# Patient Record
Sex: Male | Born: 1975 | Race: White | Hispanic: No | Marital: Married | Smoking: Never smoker
Health system: Southern US, Community
[De-identification: ages and names within clinical notes are randomized; demographics above are authoritative.]

## PROBLEM LIST (undated history)

## (undated) DIAGNOSIS — R202 Paresthesia of skin: Secondary | ICD-10-CM

## (undated) DIAGNOSIS — I10 Essential (primary) hypertension: Secondary | ICD-10-CM

## (undated) DIAGNOSIS — R809 Proteinuria, unspecified: Secondary | ICD-10-CM

## (undated) DIAGNOSIS — E785 Hyperlipidemia, unspecified: Secondary | ICD-10-CM

## (undated) DIAGNOSIS — K219 Gastro-esophageal reflux disease without esophagitis: Secondary | ICD-10-CM

## (undated) DIAGNOSIS — R7989 Other specified abnormal findings of blood chemistry: Secondary | ICD-10-CM

## (undated) DIAGNOSIS — N179 Acute kidney failure, unspecified: Secondary | ICD-10-CM

## (undated) DIAGNOSIS — G4730 Sleep apnea, unspecified: Secondary | ICD-10-CM

## (undated) DIAGNOSIS — N189 Chronic kidney disease, unspecified: Secondary | ICD-10-CM

## (undated) DIAGNOSIS — N059 Unspecified nephritic syndrome with unspecified morphologic changes: Secondary | ICD-10-CM

## (undated) HISTORY — DX: Essential (primary) hypertension: I10

## (undated) HISTORY — DX: Paresthesia of skin: R20.2

## (undated) HISTORY — PX: OTHER SURGICAL HISTORY: SHX169

## (undated) HISTORY — DX: Hyperlipidemia, unspecified: E78.5

## (undated) HISTORY — DX: Proteinuria, unspecified: R80.9

## (undated) HISTORY — DX: Other specified abnormal findings of blood chemistry: R79.89

## (undated) HISTORY — DX: Chronic kidney disease, unspecified: N18.9

## (undated) HISTORY — DX: Sleep apnea, unspecified: G47.30

## (undated) HISTORY — DX: Unspecified nephritic syndrome with unspecified morphologic changes: N05.9

## (undated) HISTORY — DX: Acute kidney failure, unspecified: N17.9

## (undated) HISTORY — DX: Gastro-esophageal reflux disease without esophagitis: K21.9

---

## 2011-05-07 ENCOUNTER — Other Ambulatory Visit: Payer: Self-pay | Admitting: Internal Medicine

## 2011-05-07 DIAGNOSIS — R1012 Left upper quadrant pain: Secondary | ICD-10-CM

## 2011-05-08 ENCOUNTER — Ambulatory Visit
Admission: RE | Admit: 2011-05-08 | Discharge: 2011-05-08 | Disposition: A | Discharge status: Home / Self Care | Payer: 59 | Source: Ambulatory Visit | Attending: Internal Medicine | Admitting: Internal Medicine

## 2011-05-08 DIAGNOSIS — R1012 Left upper quadrant pain: Secondary | ICD-10-CM

## 2011-05-08 MED ORDER — IOHEXOL 300 MG/ML  SOLN
100.0000 mL | Freq: Once | INTRAMUSCULAR | Status: AC | PRN
  Administered 2011-05-08: 100 mL via INTRAVENOUS

## 2012-04-17 IMAGING — CT CT ABDOMEN W/ CM
2 of 4 series · 17 of 46 positions shown, 19 images · IV contrast (READICAT/WATER & [ID] OMNI [id number])
Comparison: None

CLINICAL DATA: Hepatomegaly.  Left upper quadrant pain.  Elevated
bilirubin.  Left abdominal mass.

CT ABDOMEN WITH CONTRAST
TECHNIQUE: Multidetector CT imaging of the abdomen was performed
using the standard protocol following bolus administration of
intravenous contrast.
Contrast: 100 ml Dmnipaque-KFF

[Series 2: abdomen w/ · axial · 0.70mm/px · z∈[-294,-40]mm · 14 of 56 slices shown, 16 images]
[im 3/56  soft-tissue]
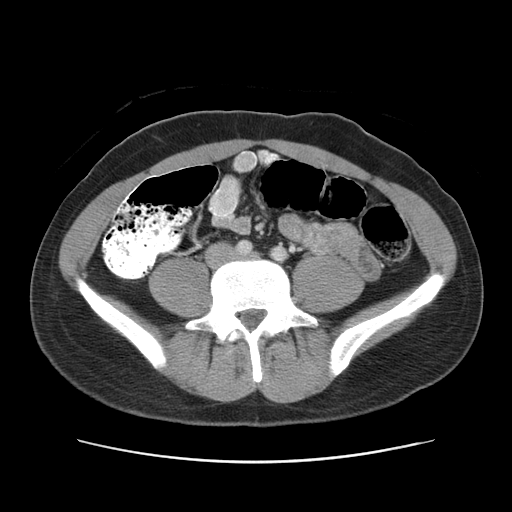
[im 3/56  bone]
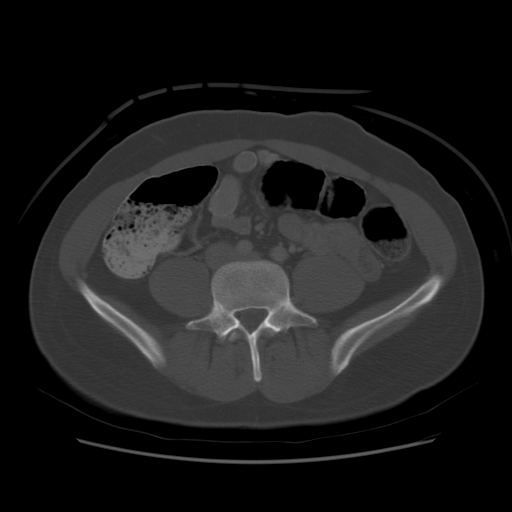
[im 8/56  soft-tissue]
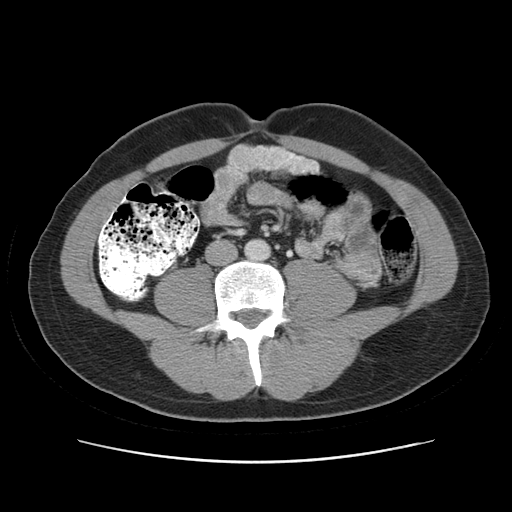
[im 11/56  soft-tissue]
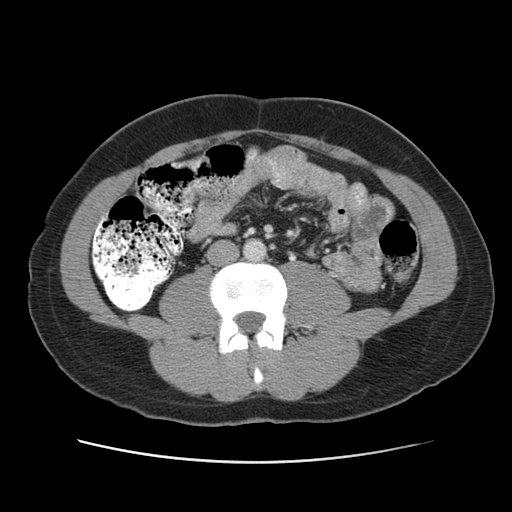
[im 16/56  soft-tissue]
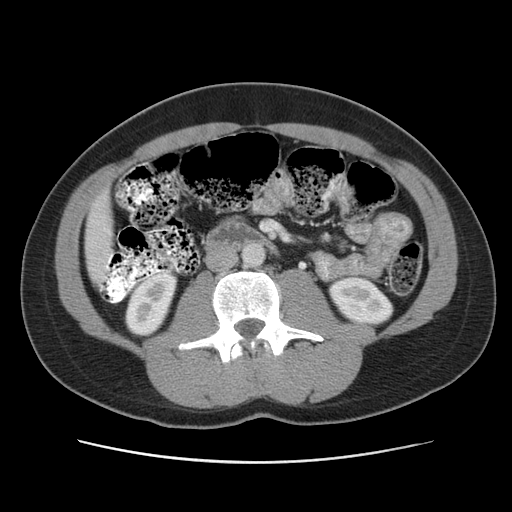
[im 18/56  soft-tissue]
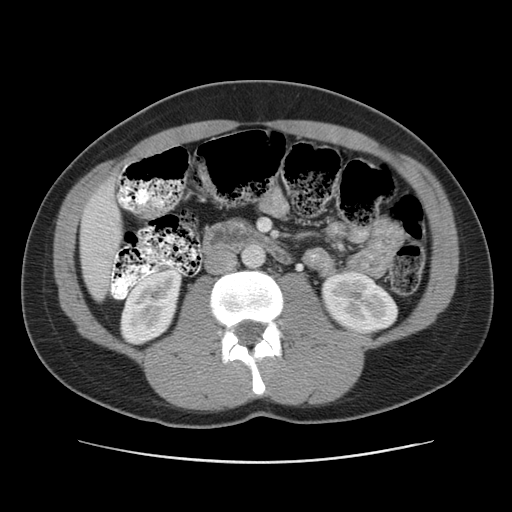
[im 23/56  soft-tissue]
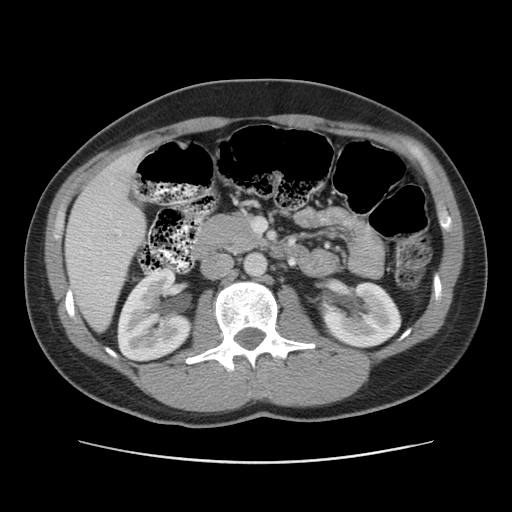
[im 26/56  soft-tissue]
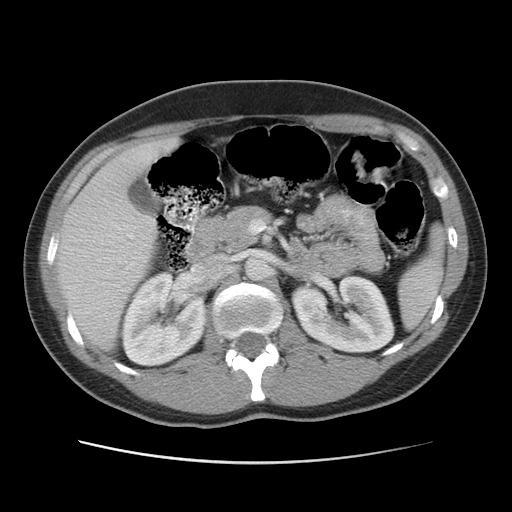
[im 31/56  soft-tissue]
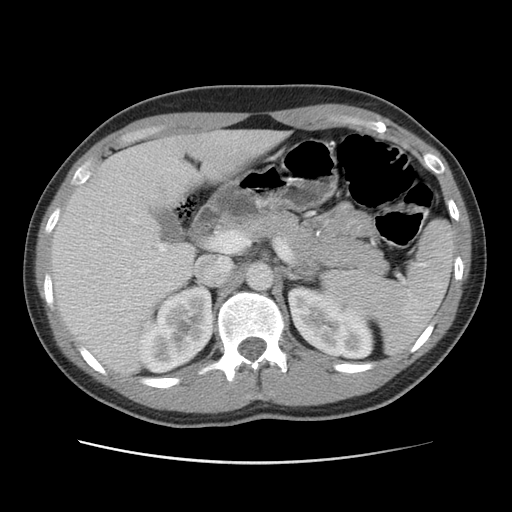
[im 33/56  soft-tissue]
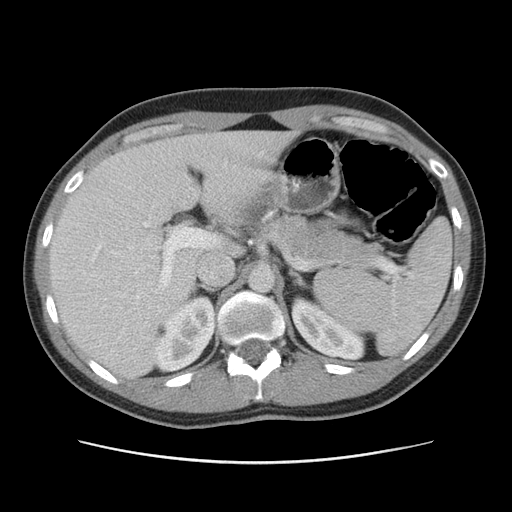
[im 33/56  bone]
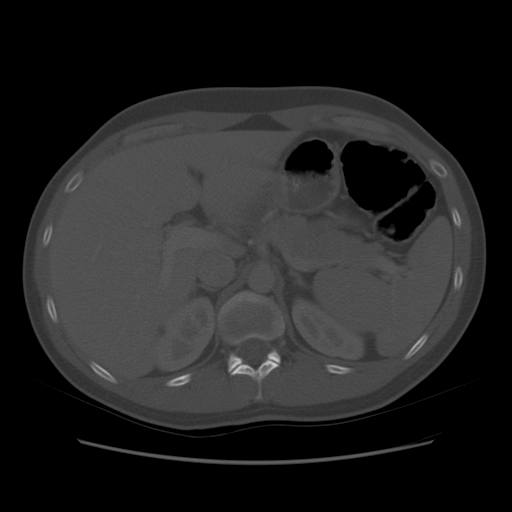
[im 38/56  soft-tissue]
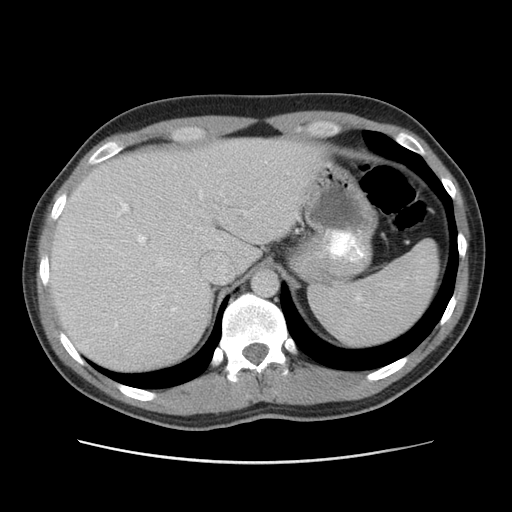
[im 41/56  soft-tissue]
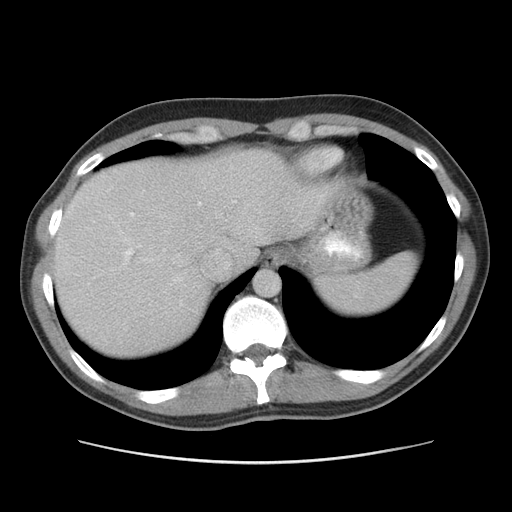
[im 46/56  soft-tissue]
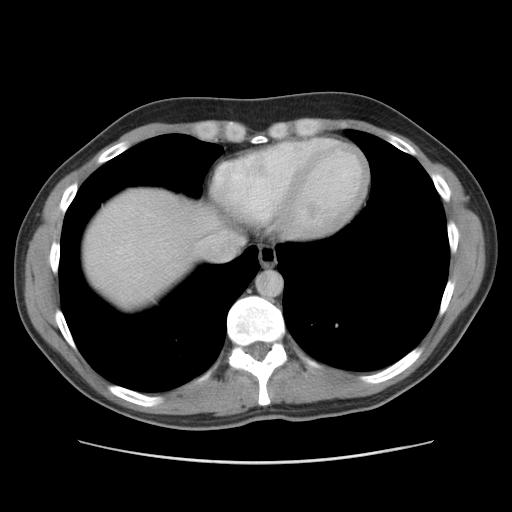
[im 48/56  soft-tissue]
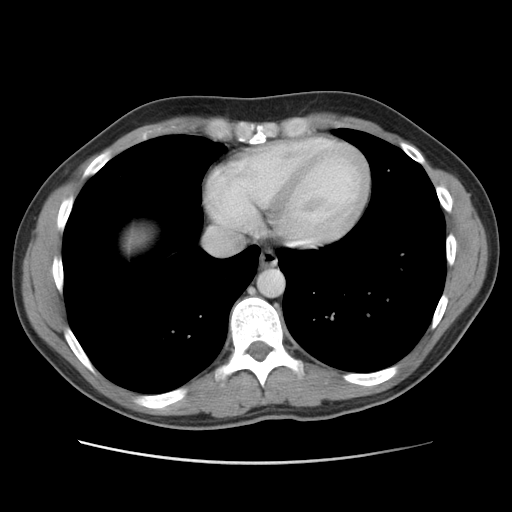
[im 53/56  soft-tissue]
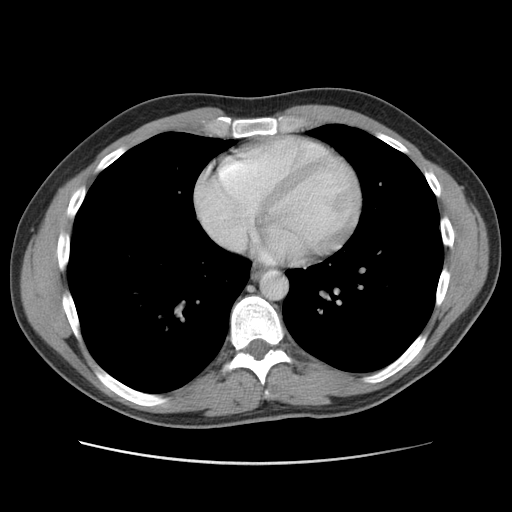

[Series 400: cor · coronal · 0.70mm/px · 3 of 108 slices shown]
[im 36/108  soft-tissue]
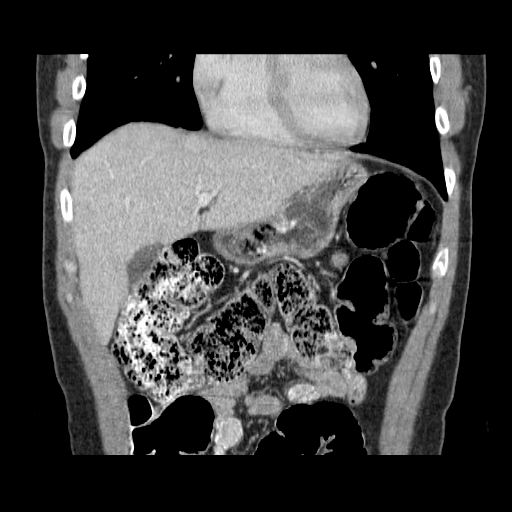
[im 48/108  soft-tissue]
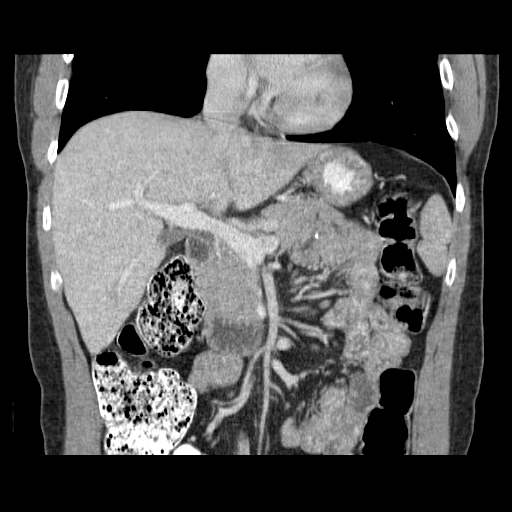
[im 60/108  soft-tissue]
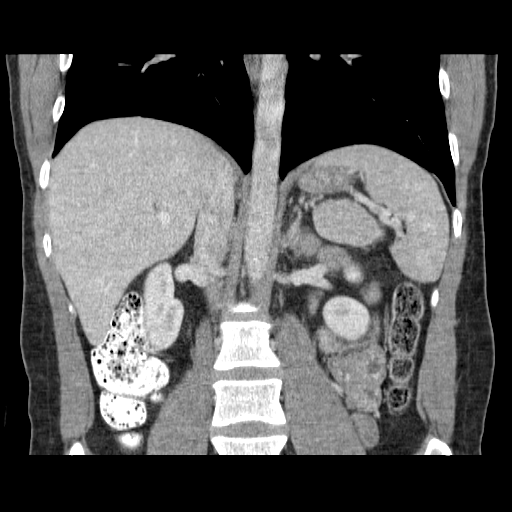

[17 of 46 positions shown; findings below may reference images not displayed]

FINDINGS: Lung bases are clear.  No pleural or pericardial fluid.

The liver parenchyma appears normal.  Liver size is normal with a
cephalocaudal extent of 16 cm.  No evidence of cyst or mass.  No
biliary ductal dilatation.  No calcified gallstones.  No evidence
of fatty change.

The spleen is normal extending over a length of 9 cm with
transverse dimension at the hilum of 4.9 x 10 cm.  No focal
lesions.

The pancreas is normal.  The adrenal glands are normal.  The
kidneys are normal.  The aorta and IVC are normal.  No
retroperitoneal mass or adenopathy.  There is a fairly large amount
of gas and fecal matter in the colon.  No primary bowel pathology
is evident.
IMPRESSION: The CT examination is within normal limits.  There is no evidence
of organomegaly or focal organ pathology.  The patient does have a
fairly large amount of gas and stool in the colon.

## 2013-05-09 ENCOUNTER — Encounter: Payer: Self-pay | Admitting: Neurology

## 2013-05-09 ENCOUNTER — Ambulatory Visit (INDEPENDENT_AMBULATORY_CARE_PROVIDER_SITE_OTHER): Payer: 59 | Admitting: Neurology

## 2013-05-09 VITALS — BP 121/76 | HR 74 | Temp 97.8°F | Ht 70.0 in | Wt 171.0 lb

## 2013-05-09 DIAGNOSIS — R202 Paresthesia of skin: Secondary | ICD-10-CM

## 2013-05-09 DIAGNOSIS — R259 Unspecified abnormal involuntary movements: Secondary | ICD-10-CM

## 2013-05-09 DIAGNOSIS — R253 Fasciculation: Secondary | ICD-10-CM

## 2013-05-09 DIAGNOSIS — R209 Unspecified disturbances of skin sensation: Secondary | ICD-10-CM

## 2013-05-09 DIAGNOSIS — R252 Cramp and spasm: Secondary | ICD-10-CM

## 2013-05-09 HISTORY — DX: Paresthesia of skin: R20.2

## 2013-05-09 NOTE — Progress Notes (Signed)
Subjective:    Patient ID: Paton Crum is a 37 y.o. male.  HPI  Huston Foley, MD, PhD [facility] [address], [address] [address] [place], [place] [address]  Dear Dr. Renne Crigler,   I saw your patient, Rayyan Burley, upon your kind request in my neurologic clinic today for initial consultation of his hand paresthesias. The patient is unaccompanied today. 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 Symptoms started in the L hand, in the palm and was initially in the aftermath of drinking more alcohol than he usually does. Around that time, he had a runny nose and woke up with nausea, head heaviness, and vomited and felt overall bad for the rest of the day. Symptoms progressed to the R hand and to the forearms and to both feet and he has had some L caff cramping. He has a longstanding Hx of fasciculations for the past 7 years and had EMG/NCV study on the LUE 7 years ago and has a longstanding Hx of anxiety. In the past few months he has had pruritus on his scalp without obvious rash, but occasionally feels bumps. He has no history of fevers or chills but does have night sweats which have been ongoing for a long time. These are intermittent. He has a FHx of dermatomyositis in his sister, no Hx of ALS, no neuropathy, no PD. He has a long-standing history of fatigue which dates back several years. He even had a sleep study several years ago which showed very mild obstructive sleep apnea. He did not need to use a CPAP machine but tried 1 on his own accord but stopped using it as he did not feel any better.   He does not drink alcohol regularly or excessively normally. He does not smoke. He has no prodromal illness at the time. Nobody else was sick around him. He was at a rheumatology conference at the time of onset of  symptoms. In the past week or 2 he has had no further symptoms. His fatigue  has improved. His calf cramping is better. He has no further tingling and never had much in the way of numbness. He never had much in the way of weakness. He does have a longer standing history of intermittent mild weakness in his left arm. He has no other new symptoms and no history of head, neck, or back injury.    His Past Medical History Is Significant For: History reviewed. No pertinent past medical history.  His Past Surgical History Is Significant For: History reviewed. No pertinent past surgical history.  His Family History Is Significant For: Family History  Problem Relation Age of Onset  . Fibromyalgia Mother   . Diabetes Mother   . Celiac disease Sister   . Anxiety disorder    . Depression      His Social History Is Significant For: History   Social History  . Marital Status: Married    Spouse Name: Judeth Cornfield    Number of Children: 2  . Years of Education: College   Occupational History  . [employer]    Social History Main Topics  . Smoking status: Never Smoker   . Smokeless tobacco: Never Used  . Alcohol Use: Yes     Comment: 2-3 cocktails weekly  . Drug Use: No  . Sexual Activity: None   Other  Topics Concern  . None   Social History Narrative   Patient lives at home with his spouse and children.   Caffeine Use: 3-4 cups of caffeine daily    His Allergies Are:  No Known Allergies:   His Current Medications Are:  Outpatient Encounter Prescriptions as of 05/09/2013  Medication Sig Dispense Refill  . acetaminophen (TYLENOL) 500 MG tablet Take 500 mg by mouth every 6 (six) hours as needed for pain.       No facility-administered encounter medications on file as of 05/09/2013.    Review of Systems  Musculoskeletal: Positive for myalgias.       Cramps  Skin:       itching    Objective:  Neurologic Exam  Physical Exam Physical Examination:   Filed Vitals:    05/09/13 0813  BP: 121/76  Pulse: 74  Temp: 97.8 F (36.6 C)    General Examination: The patient is a very pleasant 37 y.o. male in no acute distress. He appears well-developed and well-nourished and well groomed.   HEENT: Normocephalic, atraumatic, pupils are equal, round and reactive to light and accommodation. Funduscopic exam is normal with sharp disc margins noted. Extraocular tracking is good without limitation to gaze excursion or nystagmus noted. Normal smooth pursuit is noted. Hearing is grossly intact. Tympanic membranes are clear bilaterally. Face is symmetric with normal facial animation and normal facial sensation. Speech is clear with no dysarthria noted. There is no hypophonia. There is no lip, neck/head, jaw or voice tremor. Neck is supple with full range of passive and active motion. There are no carotid bruits on auscultation. Oropharynx exam reveals: no mouth dryness, adequate dental hygiene and  no significant  airway crowding. Mallampati is class I. Tongue protrudes centrally and palate elevates symmetrically. Tonsils are 1+.   Chest: Clear to auscultation without wheezing, rhonchi or crackles noted.  Heart: S1+S2+0, regular and normal without murmurs, rubs or gallops noted.   Abdomen: Soft, non-tender and non-distended with normal bowel sounds appreciated on auscultation.  Extremities: There is no pitting edema in the distal lower extremities bilaterally. Pedal pulses are intact.  Skin: Warm and dry without trophic changes noted. There are no varicose veins.  Musculoskeletal: exam reveals no obvious joint deformities, tenderness or joint swelling or erythema.   Neurologically:  Mental status: The patient is awake, alert and oriented in all 4 spheres. His memory, attention, language and knowledge are appropriate. There is no aphasia, agnosia, apraxia or anomia. Speech is clear with normal prosody and enunciation. Thought process is linear. Mood is congruent and affect is  normal.  Cranial nerves are as described above under HEENT exam. In addition, shoulder shrug is normal with equal shoulder height noted. Motor exam: Normal bulk, strength and tone is noted. There is no drift, tremor or rebound. Romberg is negative. Reflexes are 2+ throughout. Toes are downgoing bilaterally. Fine motor skills are intact with normal finger taps, normal hand movements, normal rapid alternating patting, normal foot taps and normal foot agility.  Cerebellar testing shows no dysmetria or intention tremor on finger to nose testing. Heel to shin is unremarkable bilaterally. There is no truncal or gait ataxia.  Sensory exam is intact to light touch, pinprick, vibration, temperature sense and proprioception in the upper and lower extremities.  Gait, station and balance are unremarkable. No veering to one side is noted. No leaning to one side is noted. Posture is age-appropriate and stance is narrow based. No problems turning are noted. He turns en bloc.  Tandem walk is unremarkable. Intact toe and heel stance is noted.               Assessment and Plan:   In summary, Fredy Gladu is a very pleasant 37 y.o.-year old male with a history of paresthesias that gradually progressed to involve all 4 extremities.  This started in the context of having had a more than normal for him amount of alcohol the night before and he woke up not feeling well. He vomited one time and felt bad for the rest of the day but no one else was sick around him and he had no diarrheal illness. He had no fevers. Interestingly, he has a constellation of some other constitutional symptoms which have been long-standing and other symptoms such as fasciculations which also have been ongoing for many years, not progressive. I'm not sure what caused his paresthesias and symptoms a few weeks ago and what caused them to subside. Reassuringly, his exam is completely normal at this time. I suggested that we is normal watchful waiting  position. Furthermore, I am not sure how to tie in all of his symptoms together under one possible etiology. He looks really well and recently had some blood work in your office which I reviewed which included a normal B12, normal TSH, normal CMP and CBC. I suggested that we do not proceed with any additional workup at this time as he looks so well and feels well. I asked him to consider coming back on an as-needed basis and if he has any flareup of these symptoms or any new neurological symptoms I would be happy to see him. Maybe we will add some blood work at that time, including inflammatory and autoimmune markers. At this juncture I suggested that I see him back if needed. He was in agreement.    Thank you very much for allowing me to participate in the care of this nice patient. If I can be of any further assistance to you please do not hesitate to call me at [phone].  Sincerely,   Huston Foley, MD, PhD

## 2013-05-09 NOTE — Patient Instructions (Addendum)
I think overall you are doing fairly well but I do want to suggest a few things today:   Remember to drink plenty of fluid, eat healthy meals and do not skip any meals. Try to eat protein with a every meal and eat a healthy snack such as fruit or nuts in between meals. Try to keep a regular sleep-wake schedule and try to exercise daily, particularly in the form of walking, 20-30 minutes a day, if you can.   As far as your medications are concerned, I would like to suggest no new meds.    As far as diagnostic testing: we will consider blood word with inflammatory and autoimmune markers.    I would like to see you back as needed. Please call us with any new questions, concerns, problems, updates.  We are not ordering any new test. Brett Canales is my clinical assistant and will answer any of your questions and relay your messages to me and also relay most of my messages to you.  Our phone number is [phone]. We also have an after hours call service for urgent matters and there is a physician on-call for urgent questions. For any emergencies you know to call 911 or go to the nearest emergency room.

## 2014-04-21 DEATH — deceased

## 2015-01-20 DEATH — deceased

## 2015-11-14 ENCOUNTER — Other Ambulatory Visit: Payer: Self-pay | Admitting: Internal Medicine

## 2015-11-14 DIAGNOSIS — R809 Proteinuria, unspecified: Secondary | ICD-10-CM

## 2015-11-14 DIAGNOSIS — R7989 Other specified abnormal findings of blood chemistry: Secondary | ICD-10-CM

## 2015-11-18 ENCOUNTER — Ambulatory Visit
Admission: RE | Admit: 2015-11-18 | Discharge: 2015-11-18 | Disposition: A | Discharge status: Home / Self Care | Payer: Self-pay | Source: Ambulatory Visit | Attending: Internal Medicine | Admitting: Internal Medicine

## 2015-11-18 DIAGNOSIS — R809 Proteinuria, unspecified: Secondary | ICD-10-CM

## 2015-11-18 DIAGNOSIS — R7989 Other specified abnormal findings of blood chemistry: Secondary | ICD-10-CM

## 2015-12-19 ENCOUNTER — Other Ambulatory Visit: Payer: Self-pay | Admitting: Radiology

## 2015-12-19 ENCOUNTER — Other Ambulatory Visit (HOSPITAL_COMMUNITY): Payer: Self-pay | Admitting: Nephrology

## 2015-12-19 DIAGNOSIS — N009 Acute nephritic syndrome with unspecified morphologic changes: Secondary | ICD-10-CM

## 2015-12-20 ENCOUNTER — Other Ambulatory Visit: Payer: Self-pay | Admitting: Radiology

## 2015-12-20 ENCOUNTER — Ambulatory Visit (HOSPITAL_COMMUNITY): Admission: RE | Admit: 2015-12-20 | Payer: Self-pay | Source: Ambulatory Visit

## 2015-12-20 NOTE — Patient Instructions (Signed)
Instructions for pre biopsy, needs driver, NPO after MN, am meds with sips of water,

## 2015-12-23 ENCOUNTER — Ambulatory Visit (HOSPITAL_COMMUNITY)
Admission: RE | Admit: 2015-12-23 | Discharge: 2015-12-23 | Disposition: A | Discharge status: Home / Self Care | Payer: Self-pay | Source: Ambulatory Visit | Attending: Nephrology | Admitting: Nephrology

## 2015-12-23 ENCOUNTER — Encounter (HOSPITAL_COMMUNITY): Payer: Self-pay

## 2015-12-23 DIAGNOSIS — N009 Acute nephritic syndrome with unspecified morphologic changes: Secondary | ICD-10-CM | POA: Insufficient documentation

## 2015-12-23 DIAGNOSIS — N057 Unspecified nephritic syndrome with diffuse crescentic glomerulonephritis: Secondary | ICD-10-CM | POA: Insufficient documentation

## 2015-12-23 DIAGNOSIS — I10 Essential (primary) hypertension: Secondary | ICD-10-CM | POA: Insufficient documentation

## 2015-12-23 DIAGNOSIS — Z79899 Other long term (current) drug therapy: Secondary | ICD-10-CM | POA: Insufficient documentation

## 2015-12-23 HISTORY — DX: Essential (primary) hypertension: I10

## 2015-12-23 LAB — CBC
HEMATOCRIT: 32.9 % — AB (ref 39.0–52.0)
HEMOGLOBIN: 11.8 g/dL — AB (ref 13.0–17.0)
MCH: 30.4 pg (ref 26.0–34.0)
MCHC: 35.9 g/dL (ref 30.0–36.0)
MCV: 84.8 fL (ref 78.0–100.0)
Platelets: 176 10*3/uL (ref 150–400)
RBC: 3.88 MIL/uL — AB (ref 4.22–5.81)
RDW: 13 % (ref 11.5–15.5)
WBC: 8.1 10*3/uL (ref 4.0–10.5)

## 2015-12-23 LAB — APTT: aPTT: 26 seconds (ref 24–37)

## 2015-12-23 LAB — PROTIME-INR
INR: 1.03 (ref 0.00–1.49)
Prothrombin Time: 13.7 seconds (ref 11.6–15.2)

## 2015-12-23 MED ORDER — FENTANYL CITRATE (PF) 100 MCG/2ML IJ SOLN
INTRAMUSCULAR | Status: AC
  Filled 2015-12-23: qty 2

## 2015-12-23 MED ORDER — HYDROCODONE-ACETAMINOPHEN 5-325 MG PO TABS: 1.0000 | ORAL_TABLET | ORAL | Status: DC | PRN

## 2015-12-23 MED ORDER — MIDAZOLAM HCL 2 MG/2ML IJ SOLN
INTRAMUSCULAR | Status: AC
  Filled 2015-12-23: qty 2

## 2015-12-23 MED ORDER — MIDAZOLAM HCL 2 MG/2ML IJ SOLN
INTRAMUSCULAR | Status: AC | PRN
  Administered 2015-12-23 (×2): 1 mg via INTRAVENOUS

## 2015-12-23 MED ORDER — SODIUM CHLORIDE 0.9 % IV SOLN: Freq: Once | INTRAVENOUS | Status: DC

## 2015-12-23 MED ORDER — LIDOCAINE HCL (PF) 1 % IJ SOLN
INTRAMUSCULAR | Status: AC
  Filled 2015-12-23: qty 10

## 2015-12-23 MED ORDER — FENTANYL CITRATE (PF) 100 MCG/2ML IJ SOLN
INTRAMUSCULAR | Status: AC | PRN
  Administered 2015-12-23 (×2): 50 ug via INTRAVENOUS

## 2015-12-23 MED ORDER — SODIUM CHLORIDE 0.9 % IV SOLN
INTRAVENOUS | Status: AC | PRN
  Administered 2015-12-23: 10 mL/h via INTRAVENOUS

## 2015-12-23 NOTE — H&P (Signed)
Chief Complaint: Patient was seen in consultation today for random renal biopsy at the request of [facility]  Referring Physician(s): Coladonato,Joseph  Supervising Physician: Arne Cleveland  History of Present Illness: Collin Wright is a 40 y.o. 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 He is referred for US guided random renal biopsy Has been NPO this am. PMHx, meds, labs, allergies reviewed. Wife at bedisde.  Past Medical History  Diagnosis Date  . Paresthesias 05/09/2013  . Hypertension     New diagnosis, 6 weeks ago     History reviewed. No pertinent past surgical history.  Allergies: Review of patient's allergies indicates no known allergies.  Medications: Prior to Admission medications   Medication Sig Start Date End Date Taking? Authorizing Provider  acetaminophen (TYLENOL) 500 MG tablet Take 500 mg by mouth every 6 (six) hours as needed for pain.   Yes Historical Provider, MD  LORazepam (ATIVAN) 1 MG tablet Take 1 mg by mouth as needed for anxiety (Uses when flying).    Yes Historical Provider, MD  losartan (COZAAR) 50 MG tablet Take 50 mg by mouth daily. 12/02/15  Yes Historical Provider, MD     Family History  Problem Relation Age of Onset  . Fibromyalgia Mother   . Diabetes Mother   . Celiac disease Sister   . Anxiety disorder    . Depression      Social History   Social History  . Marital Status: Married    Spouse Name: Colletta [place]  . Number of Children: 2  . Years of Education: College   Occupational History  . [employer]    Social History Main Topics  . Smoking status: Never Smoker   . Smokeless tobacco: Never Used  . Alcohol Use: Yes     Comment: 2-3 cocktails weekly  . Drug Use: No  . Sexual Activity: Yes    Birth Control/ Protection: None   Other Topics Concern  . None   Social History Narrative   Patient lives at home with his spouse and children.   Caffeine Use: 3-4 cups of caffeine daily     ECOG Status: 0 - Asymptomatic  Review of Systems: A 12 point ROS discussed and pertinent positives are indicated in the HPI above.  All other systems are negative.  Review of Systems  Vital Signs: BP 128/76 mmHg  Pulse 76  Temp(Src) 98.3 F (36.8 C)  Resp 18  Ht 5' 10"  (1.778 m)  Wt 169 lb (76.658 kg)  BMI 24.25 kg/m2  SpO2 100%  Physical Exam  Constitutional: He is oriented to person, place, and time. He appears well-developed and well-nourished. No distress.  HENT:  Head: Normocephalic.  Mouth/Throat: Oropharynx is clear and moist.  Neck: Normal range of motion. No tracheal deviation present.  Pulmonary/Chest: Effort normal and breath sounds normal. No respiratory distress.  Neurological: He is alert and oriented to person, place, and time.  Skin: Skin is warm and dry.  Psychiatric: He has a normal mood and affect. Judgment normal.    Mallampati Score:  MD Evaluation Airway: WNL Heart: WNL Abdomen: WNL Chest/ Lungs: WNL ASA  Classification: 2 Mallampati/Airway Score: One  Imaging: No results found.  Labs:  CBC:  Recent Labs  12/23/15 0700  WBC 8.1  HGB 11.8*  HCT 32.9*  PLT 176    COAGS: No results for input(s): INR, APTT in the last 8760 hours.  BMP: No results for input(s): NA, K, CL, CO2, GLUCOSE, BUN, CALCIUM, CREATININE, GFRNONAA, GFRAA in the last 8760  hours.  Invalid input(s): CMP   Assessment and Plan: Proteinuria.glomerulonephritis For US guided random renal biopsy Labs pending Risks and Benefits discussed with the patient including, but not limited to bleeding, infection, damage to adjacent structures or low yield requiring additional tests. All of the patient's questions were answered, patient is agreeable to proceed. Consent signed and in chart.    Thank you for this interesting consult.  I greatly enjoyed meeting Collin Wright and look forward to participating in their care.  A copy of this report was sent to the  requesting provider on this date.  Electronically Signed: Ascencion Dike 12/23/2015, 7:56 AM   I spent a total of 20 minutes in face to face in clinical consultation, greater than 50% of which was counseling/coordinating care for renal biopsy

## 2015-12-23 NOTE — Sedation Documentation (Signed)
Patient is resting comfortably. 

## 2015-12-23 NOTE — Discharge Instructions (Signed)
Kidney Biopsy, Care After Refer to this sheet in the next few weeks. These instructions provide you with information on caring for yourself after your procedure. Your health care provider may also give you more specific instructions. Your treatment has been planned according to current medical practices, but problems sometimes occur. Call your health care provider if you have any problems or questions after your procedure.  WHAT TO EXPECT AFTER THE PROCEDURE   You may notice blood in the urine for the first 24 hours after the biopsy.  You may feel some pain at the biopsy site for 1-2 weeks after the biopsy. HOME CARE INSTRUCTIONS  Do not lift anything heavier than 10 lb (4.5 kg) for 2 weeks.  Do not take any non-steroidal anti-inflammatory drugs (NSAIDs) or any blood thinners for a week after the biopsy unless instructed to do so by your health care provider.  Only take medicines for pain, fever, or discomfort as directed by your health care provider. SEEK MEDICAL CARE IF:  You have bloody urine more than 24 hours after the biopsy.   You develop a fever.   You cannot urinate.   You have increasing pain at the biopsy site.  SEEK IMMEDIATE MEDICAL CARE IF: You feel faint or dizzy.    This information is not intended to replace advice given to you by your health care provider. Make sure you discuss any questions you have with your health care provider.   Document Released: 05/10/2013 Document Reviewed: 05/10/2013 Elsevier Interactive Patient Education [employer].

## 2015-12-23 NOTE — Procedures (Signed)
US renal core RLP bx 16g x2 to surg path No complication No blood loss. See complete dictation in [facility].

## 2015-12-23 NOTE — Sedation Documentation (Signed)
Patient denies pain and is resting comfortably.  

## 2015-12-30 ENCOUNTER — Ambulatory Visit (HOSPITAL_COMMUNITY): Payer: Self-pay

## 2016-01-01 ENCOUNTER — Encounter (HOSPITAL_COMMUNITY): Payer: Self-pay

## 2016-02-12 ENCOUNTER — Other Ambulatory Visit (HOSPITAL_COMMUNITY): Payer: Self-pay | Admitting: Nephrology

## 2016-02-12 DIAGNOSIS — R0609 Other forms of dyspnea: Principal | ICD-10-CM

## 2016-03-04 ENCOUNTER — Other Ambulatory Visit (HOSPITAL_COMMUNITY): Payer: Self-pay

## 2016-03-10 ENCOUNTER — Other Ambulatory Visit (HOSPITAL_COMMUNITY): Payer: Self-pay

## 2016-03-11 ENCOUNTER — Encounter: Payer: Self-pay | Admitting: Cardiovascular Disease

## 2016-03-18 ENCOUNTER — Other Ambulatory Visit (HOSPITAL_COMMUNITY): Payer: Self-pay

## 2016-03-25 ENCOUNTER — Encounter: Payer: Self-pay | Admitting: Cardiovascular Disease

## 2016-03-25 ENCOUNTER — Ambulatory Visit (INDEPENDENT_AMBULATORY_CARE_PROVIDER_SITE_OTHER): Payer: Self-pay | Admitting: Cardiovascular Disease

## 2016-03-25 ENCOUNTER — Encounter (INDEPENDENT_AMBULATORY_CARE_PROVIDER_SITE_OTHER): Payer: Self-pay

## 2016-03-25 VITALS — BP 124/74 | HR 69 | Ht 70.0 in | Wt 174.4 lb

## 2016-03-25 DIAGNOSIS — R0602 Shortness of breath: Secondary | ICD-10-CM

## 2016-03-25 DIAGNOSIS — N289 Disorder of kidney and ureter, unspecified: Secondary | ICD-10-CM

## 2016-03-25 DIAGNOSIS — I151 Hypertension secondary to other renal disorders: Secondary | ICD-10-CM

## 2016-03-25 DIAGNOSIS — R0609 Other forms of dyspnea: Secondary | ICD-10-CM | POA: Insufficient documentation

## 2016-03-25 DIAGNOSIS — N2889 Other specified disorders of kidney and ureter: Secondary | ICD-10-CM

## 2016-03-25 NOTE — Progress Notes (Signed)
Cardiology Office Note   Date:  03/25/2016   ID:  Collin Wright, DOB [birth year], MRN [id number]  PCP:  Horatio Pel, MD  Cardiologist:   Mertie Moores, MD   Chief Complaint  Patient presents with  . Shortness of Breath   Problem list 1. Essential hypertension 2. IgA nephropathy - Proteinuria 3. Chronic kidney disease   History of Present Illness: Collin Wright is a 40 y.o. male who presents for further evaluation of shortness of breath. He has IgA nephropathy which was diagnosed after he presented with proteinuria. His chronic kidney disease. He Has recently has been having some problems with shortness of breath with exertion.  Went for a physical for life insurance - was found to have proteinuria - eventually was found to have IgA nephropathy Started on Losartan Also discovered that he was anemic. Sees Dr. Burman Foster -  Thought to be Epo related.  Has had a decrease in exercise tolerence.  Has noticed some DOE with jogging and also some chest pain with jogging .    Never smoker.   Cholesterol levels have been good Family hx of IgA nephropathy by biopsy Was also found to  Have C1Q antibodies ( found in  Lupus )  Father died of MI ( smoker , obesity )  HTN in mother    Has been health,   Jogs regularly but not much for the past 6-8 weeks.  No PND or orthopnea  No edema .   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 Paresthesias 05/09/2013  . Hypertension     New diagnosis, 6 weeks ago   . Nephritis   . Non-nephrotic range proteinuria   . Acute kidney injury ([place])   . Accelerated hypertension   . Glomerulonephritis     Past Surgical History  Procedure Laterality Date  . None       Current Outpatient Prescriptions  Medication Sig Dispense Refill  . acetaminophen (TYLENOL) 500 MG tablet Take 500 mg by mouth every 6 (six) hours as needed for pain.    Marland Kitchen LORazepam (ATIVAN) 1  MG tablet Take 1 mg by mouth as needed for anxiety (Uses when flying).     Marland Kitchen losartan (COZAAR) 50 MG tablet Take 50 mg by mouth 2 (two) times daily.   2   No current facility-administered medications for this visit.    Allergies:   Review of patient's allergies indicates no known allergies.    Social History:  The patient  reports that he has never smoked. He has never used smokeless tobacco. He reports that he drinks alcohol. He reports that he does not use illicit drugs.   Family History:  The patient's family history includes Anxiety disorder in his sister; CAD in his father; Celiac disease in his mother; Diabetes in his mother; Fibromyalgia in his mother; Hypertension in his father and mother; Kidney disease in his sister.    ROS:  Please see the history of present illness.    Review of Systems: Constitutional:  denies fever, chills, diaphoresis, appetite change and fatigue.  HEENT: denies photophobia, eye pain, redness, hearing loss, ear pain, congestion, sore throat, rhinorrhea, sneezing, neck pain, neck stiffness and tinnitus.  Respiratory: admits to SOB, DOE, cough, chest tightness, and wheezing.  Cardiovascular: denies chest pain, palpitations and leg swelling.  Gastrointestinal: denies nausea, vomiting, abdominal pain, diarrhea, constipation, blood in stool.  Genitourinary: denies dysuria, urgency, frequency, hematuria, flank  pain and difficulty urinating.  Musculoskeletal: denies  myalgias, back pain, joint swelling, arthralgias and gait problem.   Skin: denies pallor, rash and wound.  Neurological: denies dizziness, seizures, syncope, weakness, light-headedness, numbness and headaches.   Hematological: denies adenopathy, easy bruising, personal or family bleeding history.  Psychiatric/ Behavioral: denies suicidal ideation, mood changes, confusion, nervousness, sleep disturbance and agitation.       All other systems are reviewed and negative.    PHYSICAL EXAM: VS:  Ht  5' 10"  (1.778 m)  Wt 174 lb 6.4 oz (79.107 kg)  BMI 25.02 kg/m2 , BMI Body mass index is 25.02 kg/(m^2). GEN: Well nourished, well developed, in no acute distress HEENT: normal Neck: no JVD, carotid bruits, or masses Cardiac: RRR; no murmurs, rubs, or gallops,no edema  Respiratory:  clear to auscultation bilaterally, normal work of breathing GI: soft, nontender, nondistended, + BS MS: no deformity or atrophy Skin: warm and dry, no rash Neuro:  Strength and sensation are intact Psych: normal   EKG:  EKG is ordered today. The ekg ordered today demonstrates NSR at 69.     Recent Labs: 12/23/2015: Hemoglobin 11.8*; Platelets 176    Lipid Panel No results found for: CHOL, TRIG, HDL, CHOLHDL, VLDL, LDLCALC, LDLDIRECT    Wt Readings from Last 3 Encounters:  03/25/16 174 lb 6.4 oz (79.107 kg)  12/23/15 169 lb (76.658 kg)  05/09/13 171 lb (77.565 kg)      Other studies Reviewed: Additional studies/ records that were reviewed today include: . Review of the above records demonstrates:    ASSESSMENT AND PLAN:  1.  Dyspnea with exertion: Collin Wright presents with dyspnea on exertion for the past several months.   He's been diagnosed with hypertension within the past 6 weeks. He also has developed chronic kidney disease that is thought to be due to an IgA nephropathy or similar disease. He's been healthy all his life but has noticed shortness of breath with jogging over the past several weeks. He also has been found to have anemia.  We discussed possible etiologies. I think that he has a low likelihood of having significant coronary artery disease. We discussed getting a coronary calcium score is a question needs to be answered. I think our best option is to proceed with an echocardiogram which will give [place] left ventricular function as well as valvular function. He's also concerned about some pulmonary hypertension also Bil she's given his chronic kidney disease.  We also discussed possibly  getting a cardiac MRI if we need definitive evaluation of his cardiac structure.  I'll see him on an as-needed basis.  2. Hypertension: He is currently on losartan. His blood pressure is well-controlled.  3. IgA nephropathy-further plans and management by Dr.  Sheela Stack.  4. 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 Further plans per nephrology.     Current medicines are reviewed at length with the patient today.  The patient does not have concerns regarding medicines.  The following changes have been made:  no change  Labs/ tests ordered today include:  No orders of the defined types were placed in this encounter.     Disposition:   FU with me as needed     Mertie Moores, MD  03/25/2016 8:55 AM    [facility] [place], [place], [place]  [address] Phone: [phone]; Fax: [phone]   [facility]  [address] [place], [place]  [address] [phone]   Fax (  [phone]    

## 2016-03-25 NOTE — Patient Instructions (Signed)
Medication Instructions:  Your physician recommends that you continue on your current medications as directed. Please refer to the Current Medication list given to you today.  Labwork: No new orders.   Testing/Procedures: Your physician has requested that you have an echocardiogram. Echocardiography is a painless test that uses sound waves to create images of your heart. It provides your doctor with information about the size and shape of your heart and how well your heart's chambers and valves are working. This procedure takes approximately one hour. There are no restrictions for this procedure.  Follow-Up: Your physician recommends that you schedule a follow-up appointment as needed with Dr Acie Fredrickson.    Any Other Special Instructions Will Be Listed Below (If Applicable).     If you need a refill on your cardiac medications before your next appointment, please call your pharmacy.

## 2016-04-09 ENCOUNTER — Ambulatory Visit (HOSPITAL_COMMUNITY): Payer: Self-pay | Attending: Cardiology

## 2016-04-09 ENCOUNTER — Other Ambulatory Visit: Payer: Self-pay

## 2016-04-09 DIAGNOSIS — I358 Other nonrheumatic aortic valve disorders: Secondary | ICD-10-CM | POA: Insufficient documentation

## 2016-04-09 DIAGNOSIS — N2889 Other specified disorders of kidney and ureter: Secondary | ICD-10-CM

## 2016-04-09 DIAGNOSIS — I151 Hypertension secondary to other renal disorders: Secondary | ICD-10-CM

## 2016-04-09 DIAGNOSIS — I071 Rheumatic tricuspid insufficiency: Secondary | ICD-10-CM | POA: Insufficient documentation

## 2016-04-09 DIAGNOSIS — R0602 Shortness of breath: Secondary | ICD-10-CM

## 2016-04-09 DIAGNOSIS — I371 Nonrheumatic pulmonary valve insufficiency: Secondary | ICD-10-CM | POA: Insufficient documentation

## 2016-10-28 IMAGING — US US RENAL
1 series · 14 of 25 positions shown · non-contrast
Comparison: 05/08/2011 CTA of the abdomen.

CLINICAL DATA: Elevated creatinine.  Proteinuria.

EXAM:
RENAL / URINARY TRACT ULTRASOUND COMPLETE

[Series 1: us renal · 0.22mm/px · 14 of 30 slices shown]
[im 1/30]
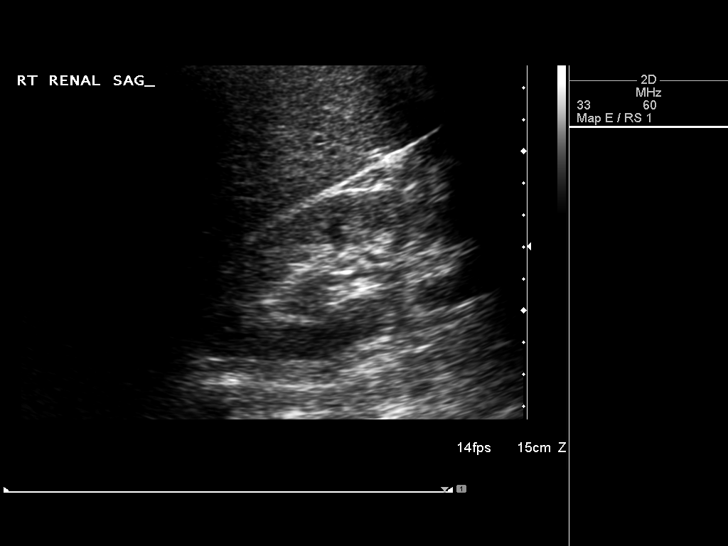
[im 3/30]
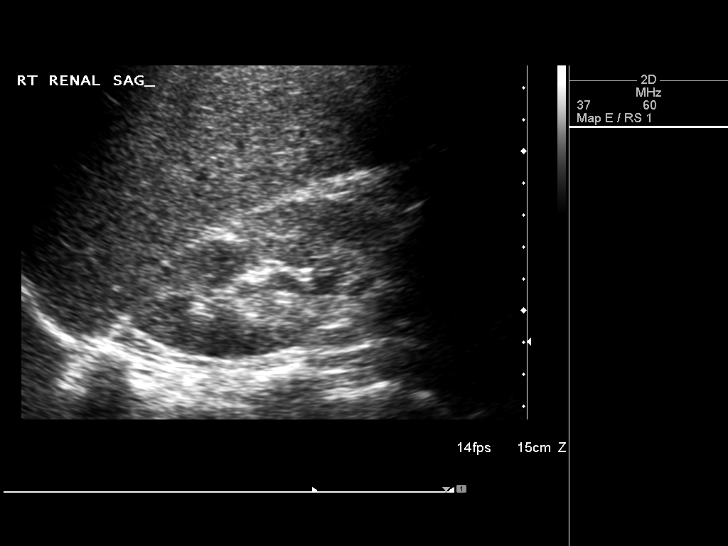
[im 5/30]
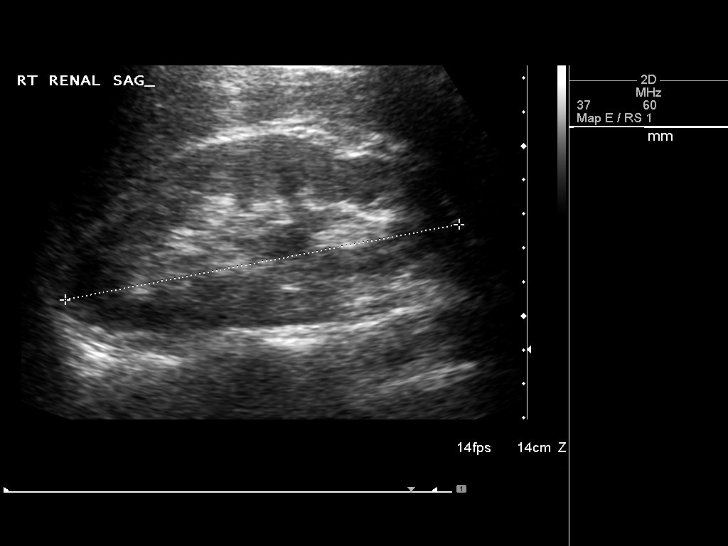
[im 8/30]
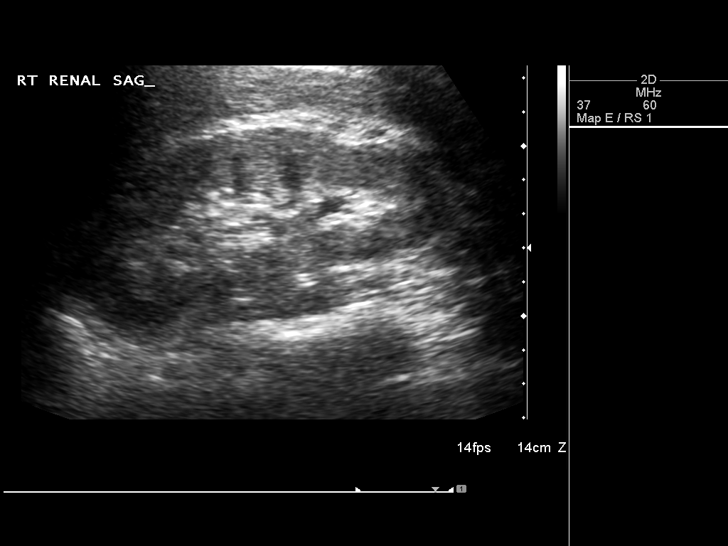
[im 10/30]
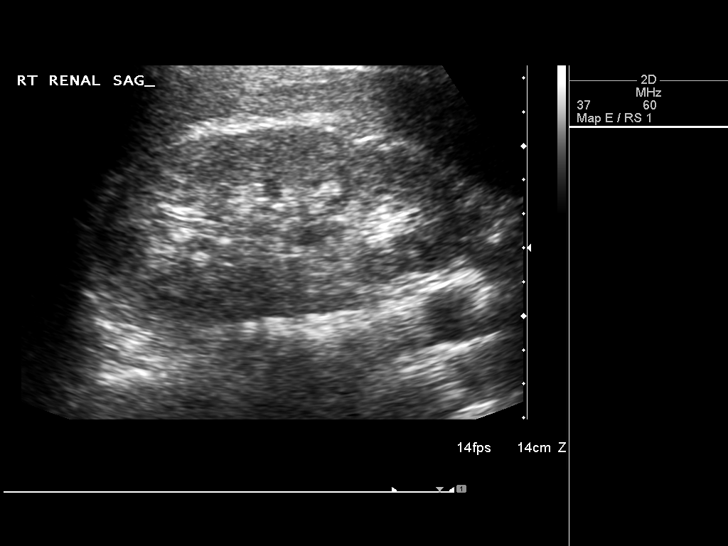
[im 11/30]
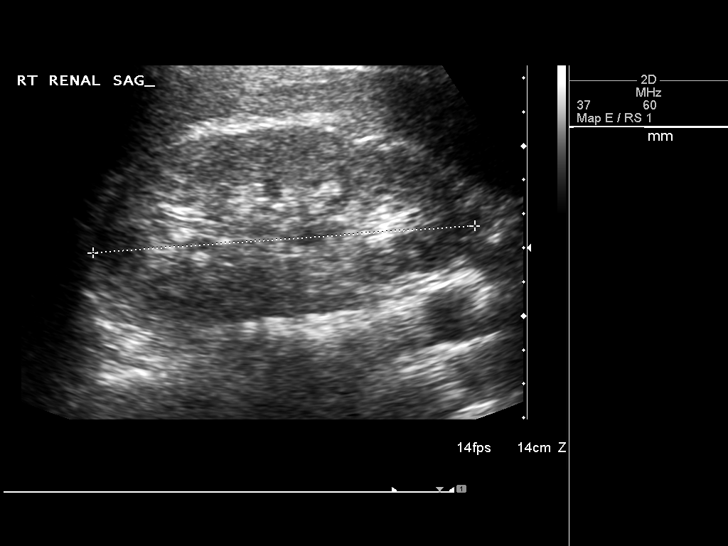
[im 14/30]
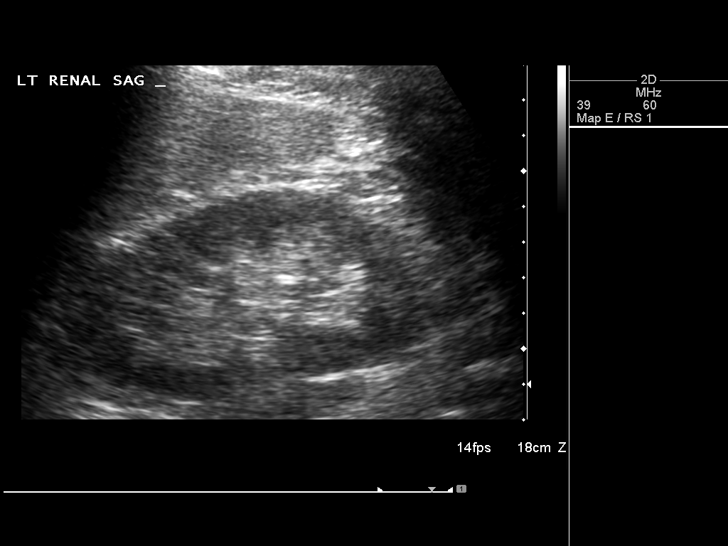
[im 16/30]
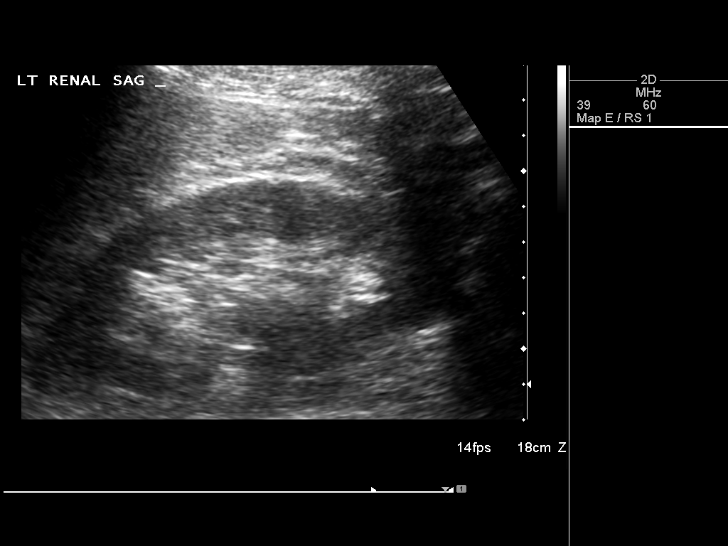
[im 19/30]
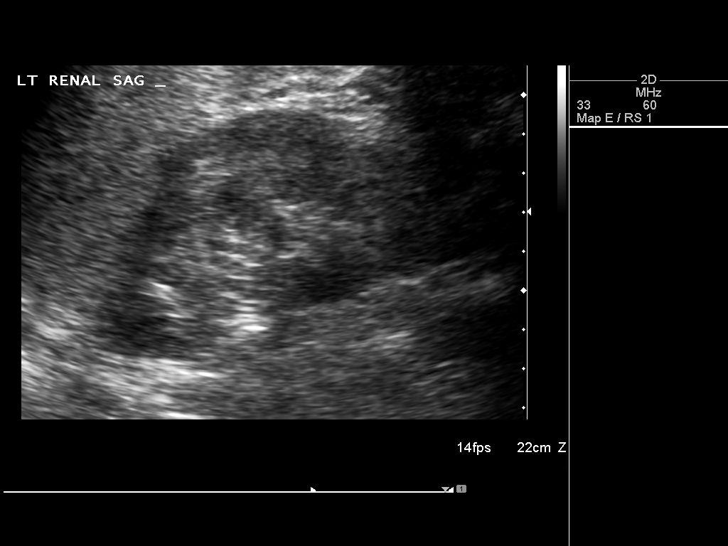
[im 20/30]
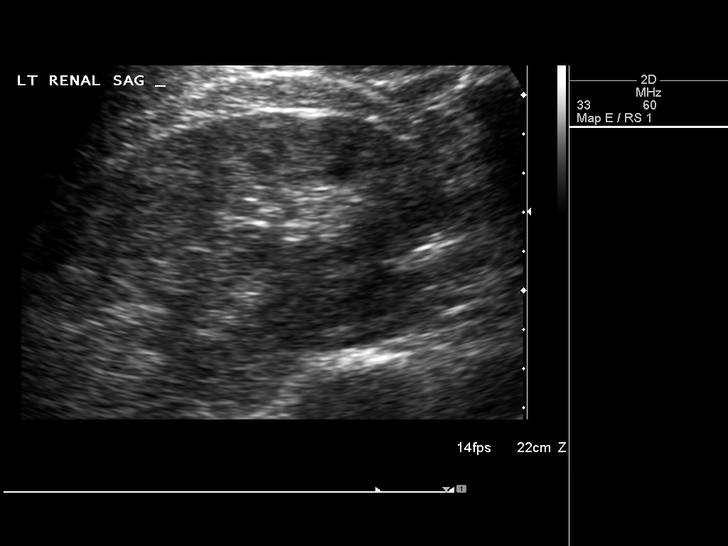
[im 22/30]
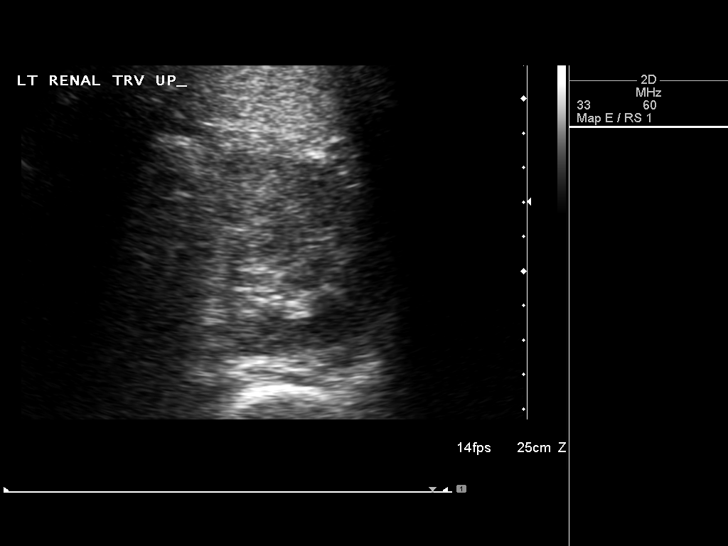
[im 25/30]
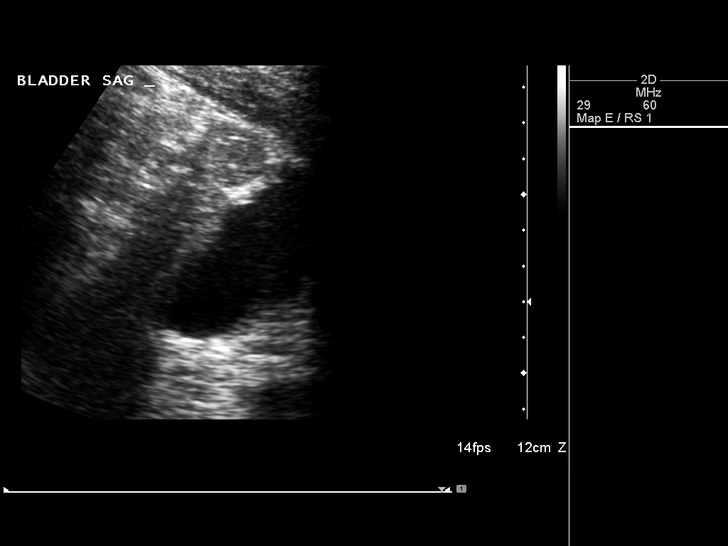
[im 27/30]
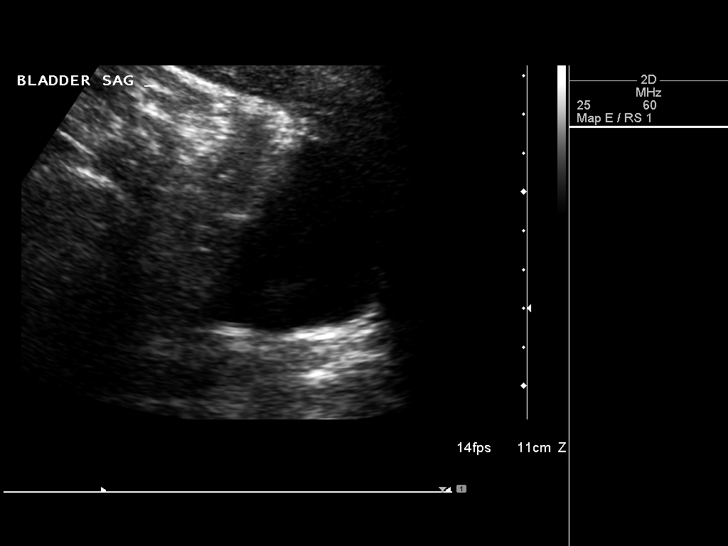
[im 30/30]
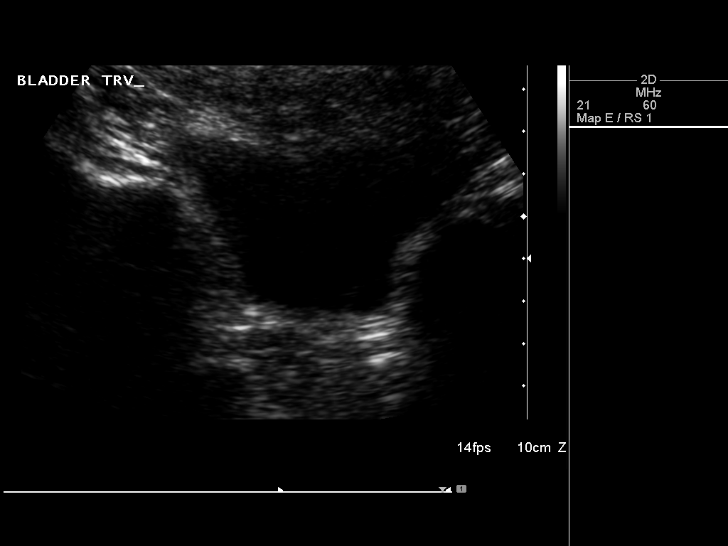

[14 of 25 positions shown; findings below may reference images not displayed]

FINDINGS: Right Kidney:

Length: 11.8 cm. Echogenicity within normal limits. No mass or
hydronephrosis visualized.

Left Kidney:

Length: 11.8 cm. Echogenicity within normal limits. No mass or
hydronephrosis visualized.

Bladder:

Appears normal for degree of bladder distention.
IMPRESSION: Normal renal ultrasound.  No explanation for elevated creatinine.

## 2016-12-02 IMAGING — US US BIOPSY
1 series · 10 of 10 positions shown · non-contrast
Comparison: Ultrasound 11/18/2015

CLINICAL DATA: acute glomerulonephritis

EXAM:
ULTRASOUND GUIDED RENAL CORE BIOPSY
TECHNIQUE: The procedure, risks (including but not limited to bleeding,
infection, organ damage ), benefits, and alternatives were explained
to the patient. Questions regarding the procedure were encouraged
and answered. The patient understands and consents to the procedure.
Survey ultrasound was performed and an appropriate skin entry site
was localized. Site was marked, prepped with chlorhexidine, draped
in usual sterile fashion, infiltrated locally with 1% lidocaine.
Intravenous Fentanyl and Versed were administered as conscious
sedation during continuous cardiorespiratory monitoring by the
radiology RN with a total moderate sedation time of 10 minutes.
Under real time ultrasound guidance, a 15 gauge trocar needle was
advanced to the margin of the lower pole of the right kidney for
coaxial core biopsy using a 16 gauge device in 2 passes. The core
samples were submitted to pathology. The patient tolerated procedure
well.
Complications: None.

[Series 1: us biopsy · 0.17mm/px · 10 of 10 slices shown]
[im 1/10]
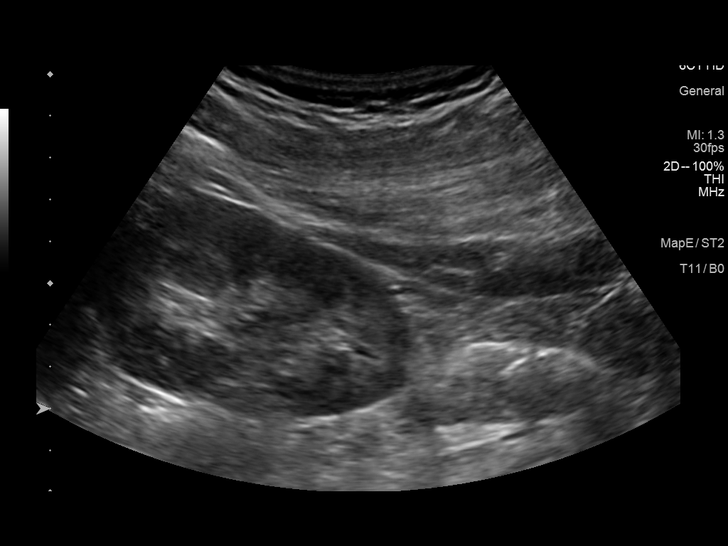
[im 2/10]
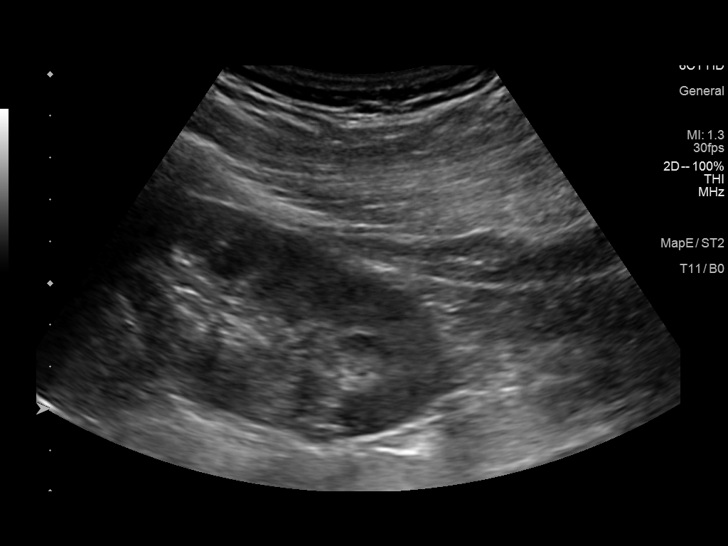
[im 3/10]
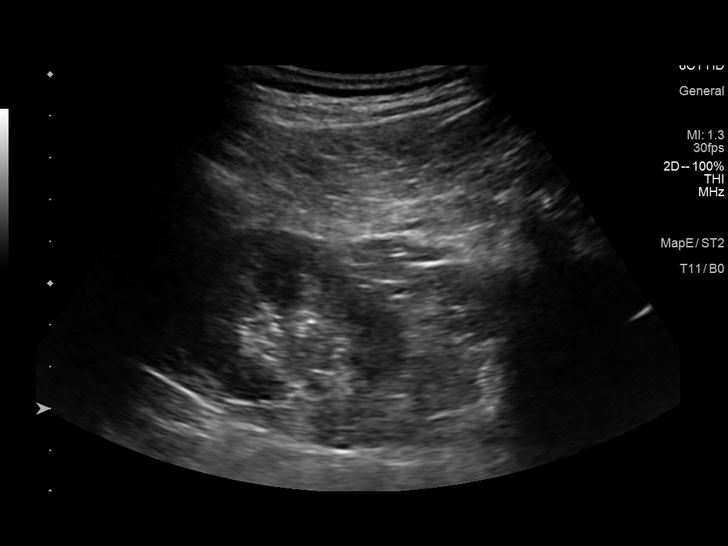
[im 4/10]
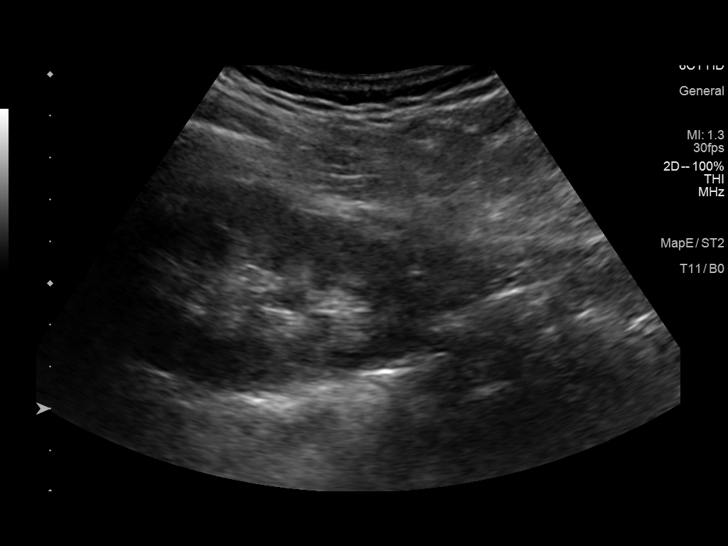
[im 5/10]
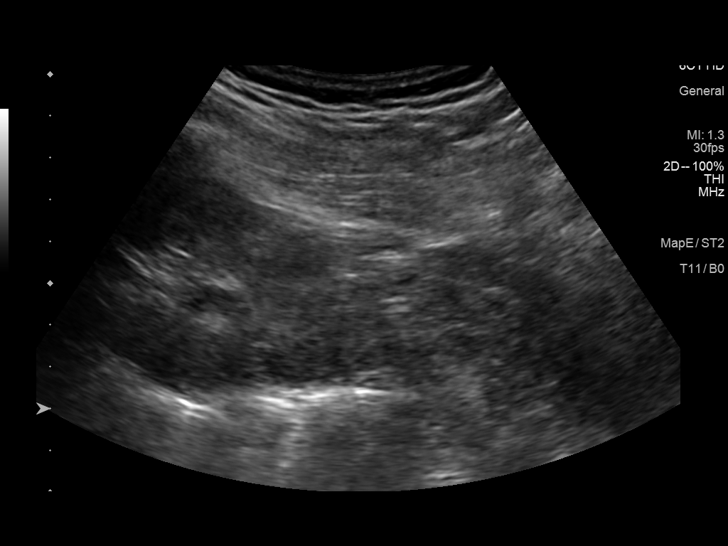
[im 6/10]
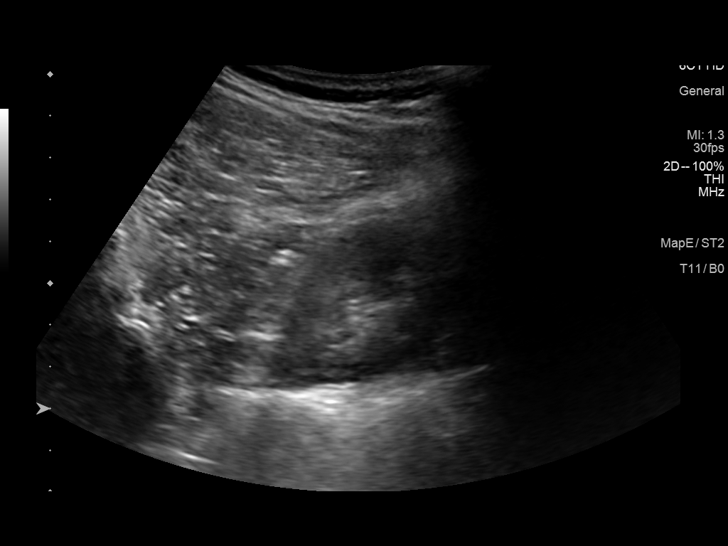
[im 7/10]
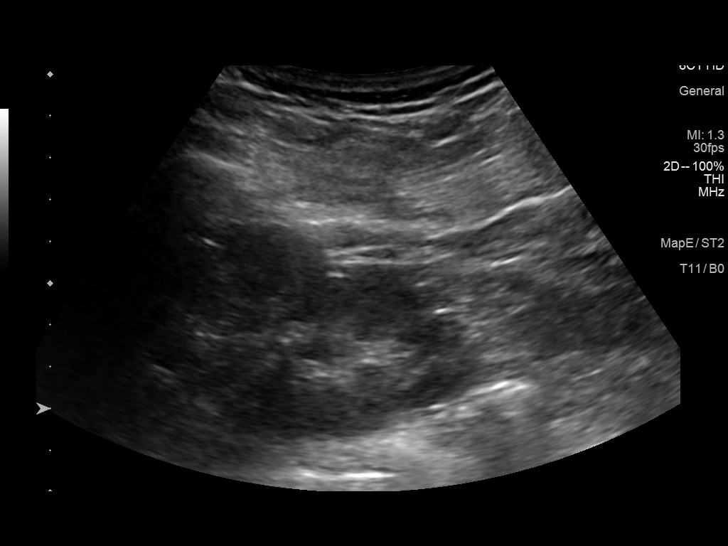
[im 8/10]
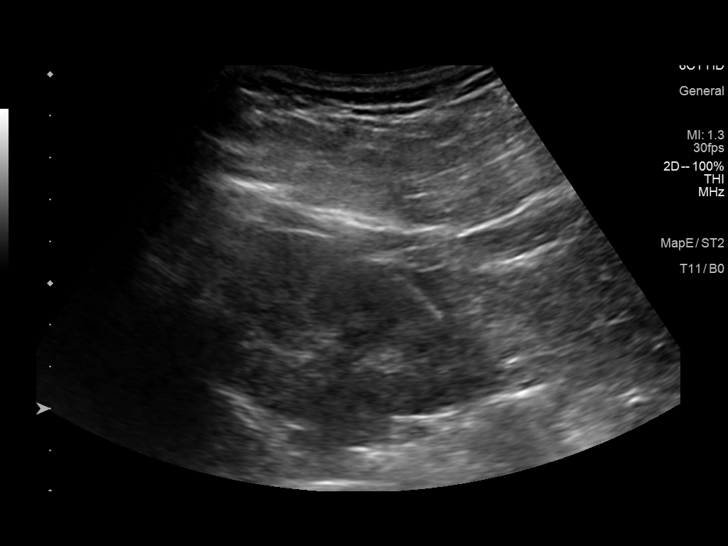
[im 9/10]
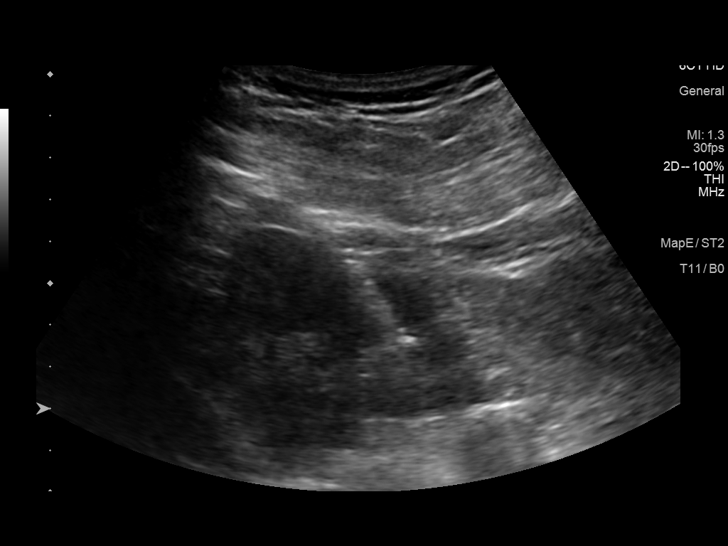
[im 10/10]
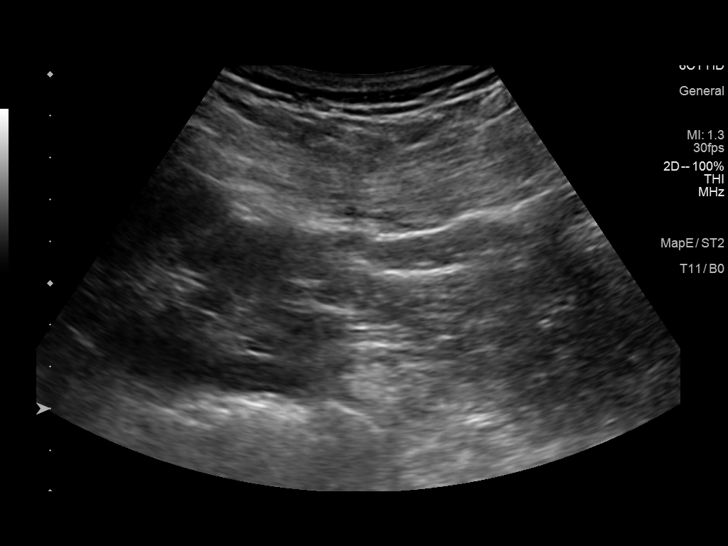

[10 of 10 positions shown; findings below may reference images not displayed]

IMPRESSION: 1. Technically successful ultrasound-guided core renal biopsy ,
right lower pole.

## 2018-05-02 ENCOUNTER — Ambulatory Visit: Payer: Self-pay | Admitting: Podiatry

## 2018-09-21 HISTORY — PX: LIPOSUCTION: SHX10

## 2019-01-20 DEATH — deceased

## 2021-07-06 LAB — COLOGUARD: COLOGUARD: NEGATIVE

## 2021-09-02 ENCOUNTER — Other Ambulatory Visit: Payer: Self-pay | Admitting: Urology

## 2021-09-02 DIAGNOSIS — R3129 Other microscopic hematuria: Secondary | ICD-10-CM

## 2021-09-04 ENCOUNTER — Other Ambulatory Visit: Payer: Self-pay

## 2021-09-24 ENCOUNTER — Ambulatory Visit
Admission: RE | Admit: 2021-09-24 | Discharge: 2021-09-24 | Disposition: A | Discharge status: Home / Self Care | Payer: No Typology Code available for payment source | Source: Ambulatory Visit | Attending: Urology | Admitting: Urology

## 2021-09-24 DIAGNOSIS — R3129 Other microscopic hematuria: Secondary | ICD-10-CM

## 2022-09-04 IMAGING — US US RENAL
1 series · 14 of 25 positions shown · non-contrast
Comparison: Renal ultrasound dated 11/18/2015.

CLINICAL DATA: Microscopic hematuria.

EXAM:
RENAL / URINARY TRACT ULTRASOUND COMPLETE

[Series 1: us renal · 0.25mm/px · 14 of 41 slices shown]
[im 1/41]
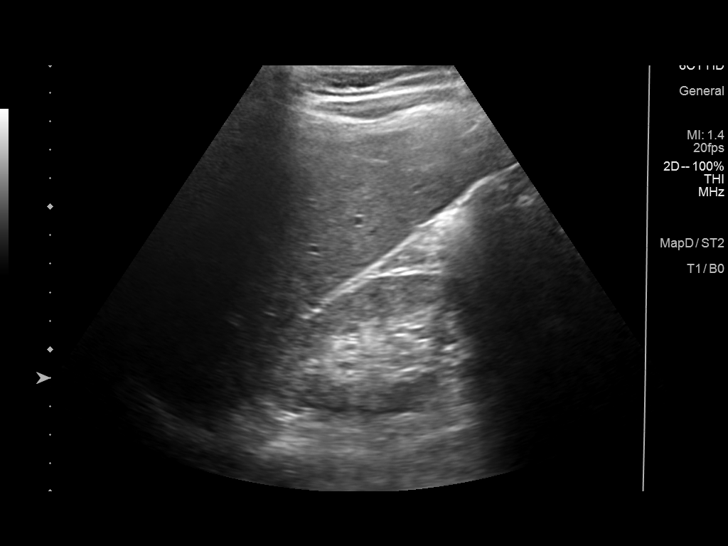
[im 4/41]
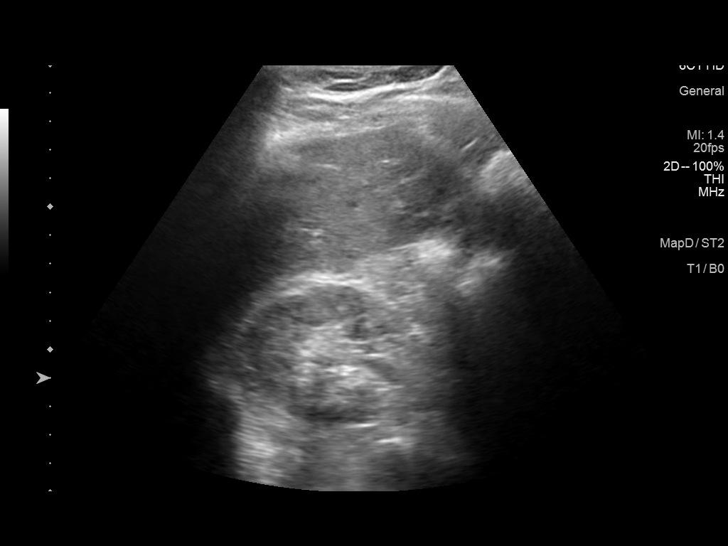
[im 7/41]
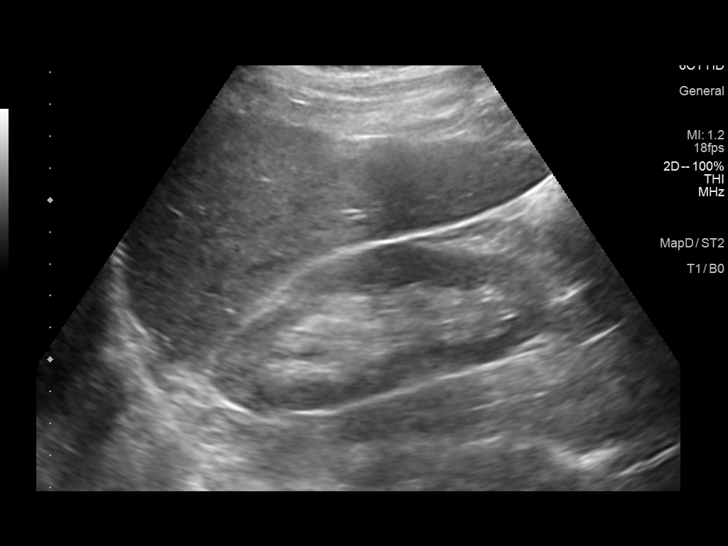
[im 11/41]
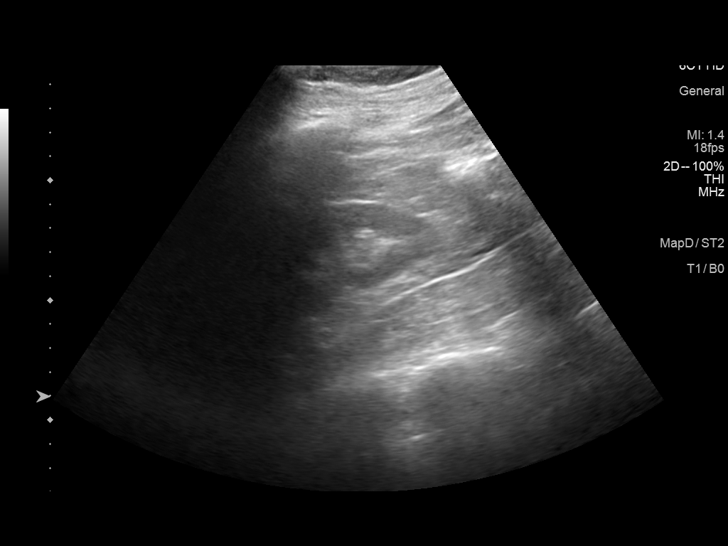
[im 14/41]
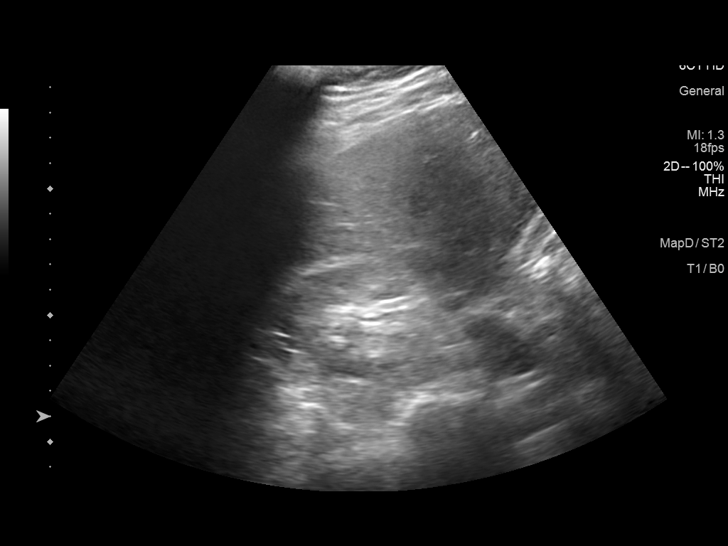
[im 16/41]
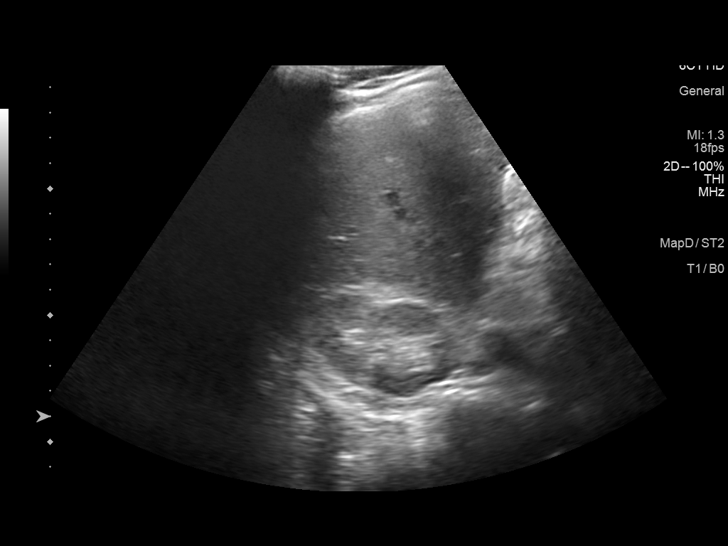
[im 19/41]
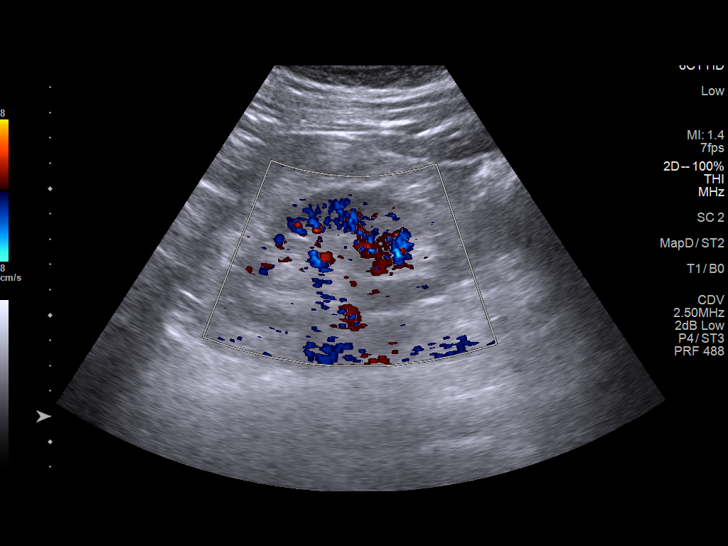
[im 22/41]
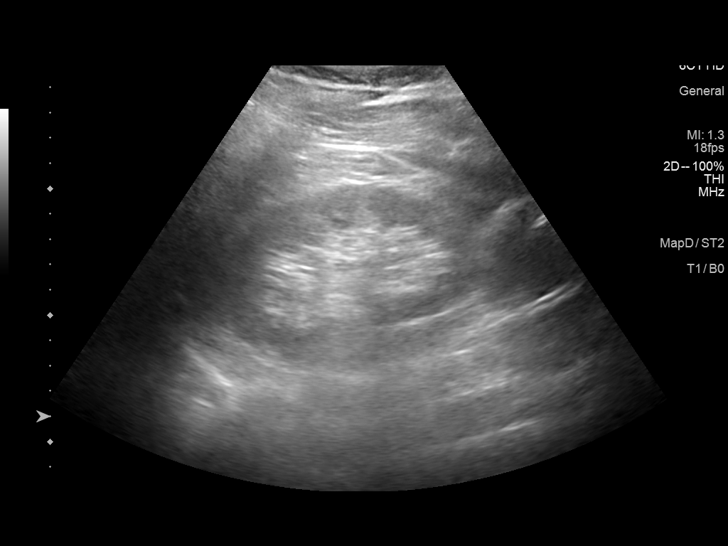
[im 26/41]
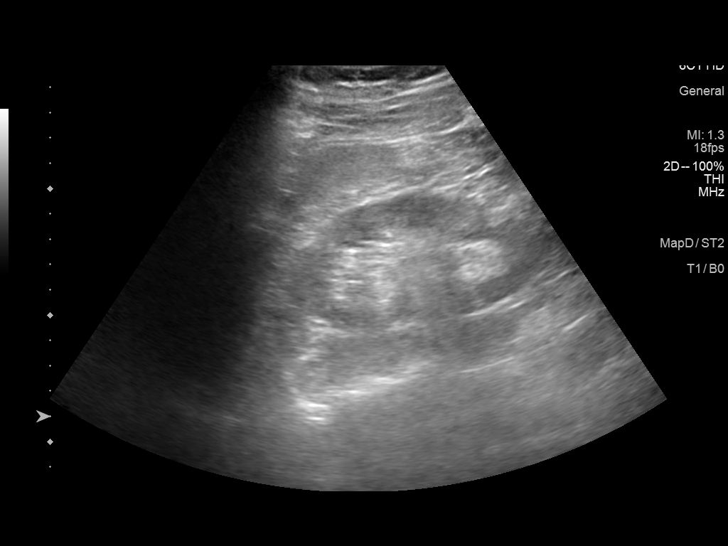
[im 27/41]
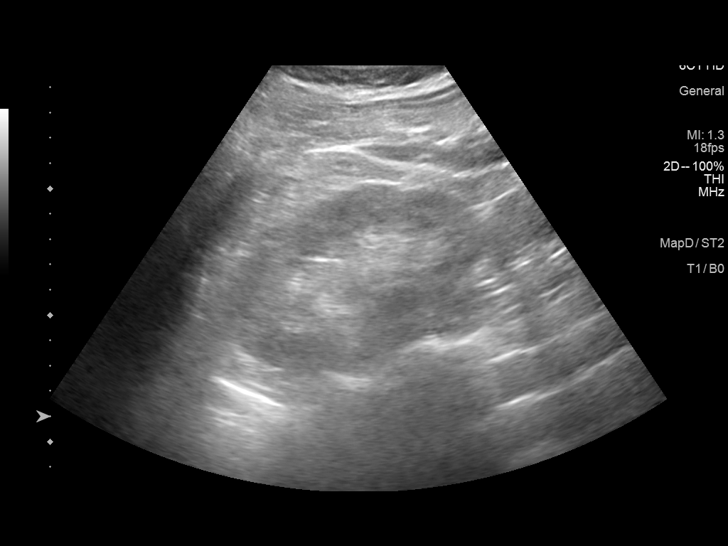
[im 31/41]
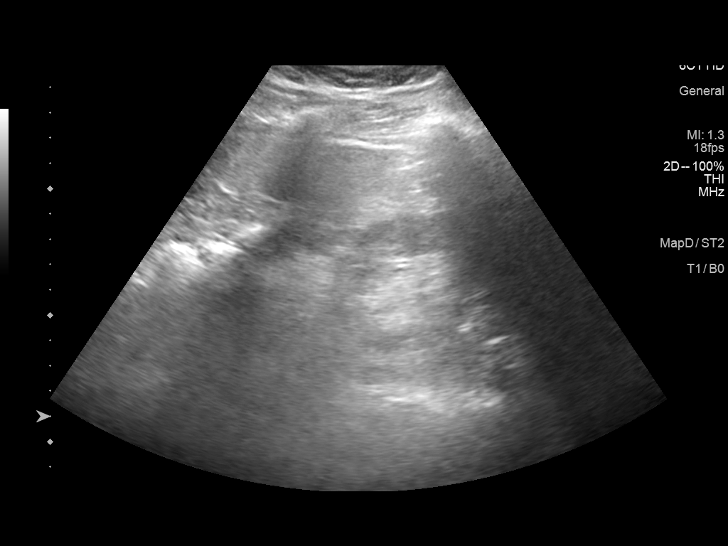
[im 34/41]
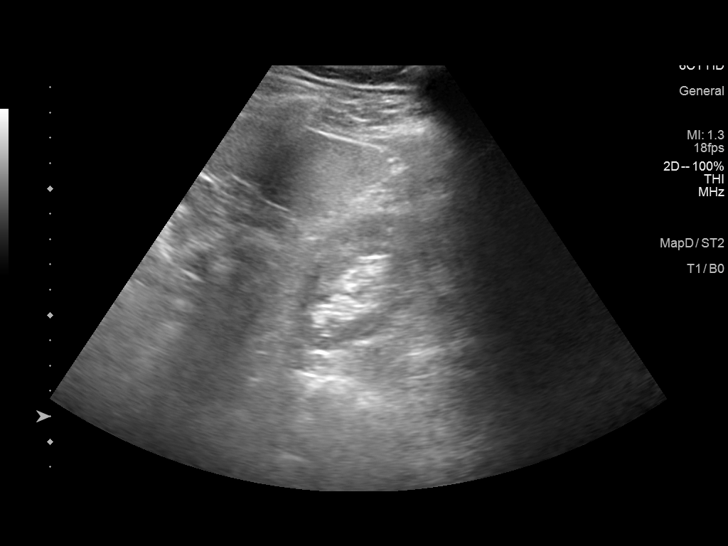
[im 37/41]
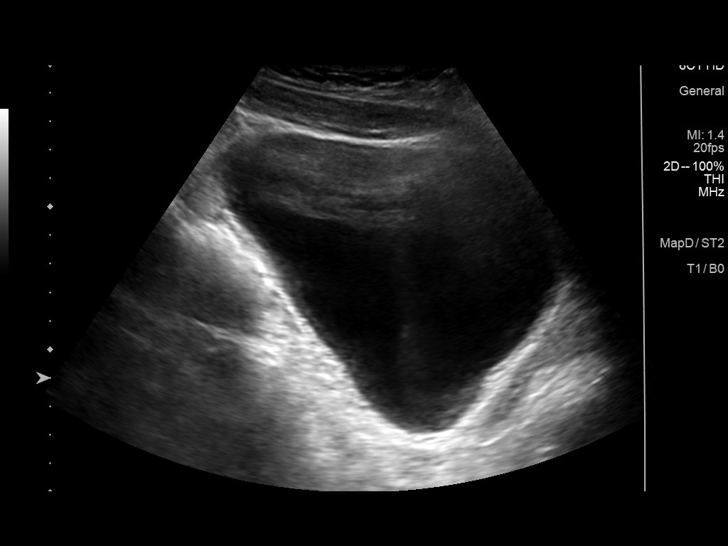
[im 41/41]
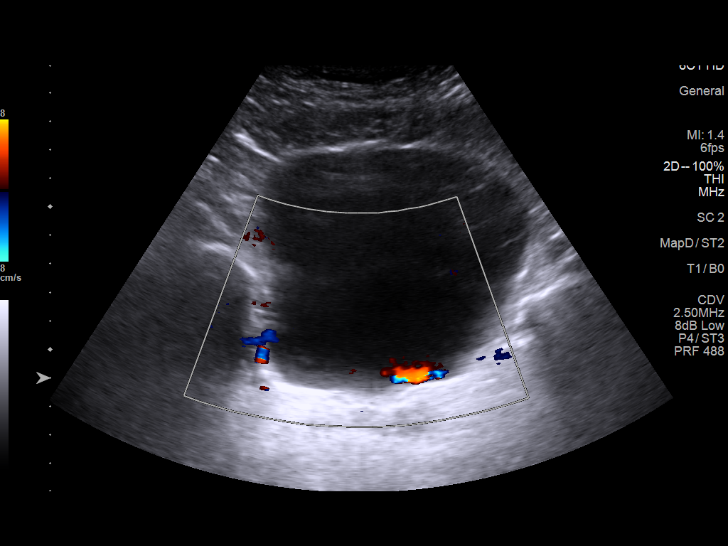

[14 of 25 positions shown; findings below may reference images not displayed]

FINDINGS: Right Kidney:

Renal measurements: 11.3 x 4.3 x 5.3 cm = volume: 134 mL.
Echogenicity within normal limits. No mass or hydronephrosis
visualized.

Left Kidney:

Renal measurements: 11.3 x 5.3 x 5.6 cm = volume: 174 mL.
Echogenicity within normal limits. No mass or hydronephrosis
visualized.

Bladder:

Appears normal for degree of bladder distention.

Other:

None.
IMPRESSION: Unremarkable renal ultrasound.

## 2022-11-03 ENCOUNTER — Other Ambulatory Visit: Payer: Self-pay | Admitting: Internal Medicine

## 2022-11-03 DIAGNOSIS — E785 Hyperlipidemia, unspecified: Secondary | ICD-10-CM

## 2022-12-07 ENCOUNTER — Ambulatory Visit
Admission: RE | Admit: 2022-12-07 | Discharge: 2022-12-07 | Disposition: A | Discharge status: Home / Self Care | Payer: No Typology Code available for payment source | Source: Ambulatory Visit | Attending: Internal Medicine | Admitting: Internal Medicine

## 2022-12-07 DIAGNOSIS — E785 Hyperlipidemia, unspecified: Secondary | ICD-10-CM

## 2024-09-11 ENCOUNTER — Encounter: Payer: Self-pay | Admitting: Gastroenterology

## 2024-09-11 ENCOUNTER — Ambulatory Visit: Payer: Self-pay | Admitting: Gastroenterology

## 2024-09-11 VITALS — BP 110/64 | HR 81 | Ht 71.0 in | Wt 191.4 lb

## 2024-09-11 DIAGNOSIS — Z1211 Encounter for screening for malignant neoplasm of colon: Secondary | ICD-10-CM

## 2024-09-11 DIAGNOSIS — K219 Gastro-esophageal reflux disease without esophagitis: Secondary | ICD-10-CM | POA: Diagnosis not present

## 2024-09-11 DIAGNOSIS — R1013 Epigastric pain: Secondary | ICD-10-CM | POA: Diagnosis not present

## 2024-09-11 DIAGNOSIS — N189 Chronic kidney disease, unspecified: Secondary | ICD-10-CM

## 2024-09-11 MED ORDER — NA SULFATE-K SULFATE-MG SULF 17.5-3.13-1.6 GM/177ML PO SOLN: 1.0000 | Freq: Once | ORAL | 0 refills | Status: AC

## 2024-09-11 NOTE — Progress Notes (Signed)
 "  HPI :  48 year old male with IgA nephropathy/CKD, hypertension, GERD, here to establish care for GERD and to discuss colon cancer screening.  Primary care is Collin Neth MD.  He reports having GERD and dyspepsia for at least 20 years.  Symptoms have included pyrosis with regurgitation, belching, and epigastric discomfort, which can occur sporadically. He has symptoms during day and also endorses nocturnal symptoms at times.  Severity has fluctuated over time however has escalated in recent years.  Generally has been mild historically.  He has mostly been using Pepcid 10 mg over-the-counter as needed or daily.  Despite this he has had some persistent symptoms.  Over the past 3 months he has been using omeprazole 20 mg over-the-counter and that has helped with his symptoms however has not completely resolved them.  He does continue to take Pepcid 10 mg nightly.  He does have some occasional nocturnal symptoms that can bother him.  He has used Tums and Maalox as needed in the past as well.  He denies any dysphagia.  He does have some occasional early satiety. No vomiting on a routine basis. Sometimes certain foods can trigger his reflux as well as exercise.  His weight in general has been stable over time.  He denies any family history of esophagus cancer or colon cancer.  Of note he has had a liver biopsy a few years ago and he has IgA nephropathy.  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  He wonders if he has any Barrett's esophagus or if he requires escalation of treatment over time.  He has never had a prior EGD or colonoscopy.  He did have a Cologuard that was negative at age 6.  He denies any problems with his bowel habits.  No blood in his stools.  He has not used cigarettes in the past, does smoke a cigar occasionally.  Denies any cardiopulmonary symptoms that bother him.  No reported issues with anesthesia in the  past    Past Medical History:  Diagnosis Date   Accelerated hypertension    Acute kidney injury    Chronic kidney disease    GERD (gastroesophageal reflux disease)    Glomerulonephritis    Hypertension    New diagnosis, 6 weeks ago    Low testosterone    Nephritis    Non-nephrotic range proteinuria    Paresthesias 05/09/2013     Past Surgical History:  Procedure Laterality Date   LIPOSUCTION  2020   on Chest   NONE     Family History  Problem Relation Age of Onset   Fibromyalgia Mother    Diabetes Mother    Celiac disease Mother    Hypertension Mother    Cancer - Lung Mother    Hypertension Father    CAD Father        DECEASED   Anxiety disorder Sister    Kidney disease Sister    Depression Other    Colon cancer Neg Hx    Esophageal cancer Neg Hx    Social History[1] Current Outpatient Medications  Medication Sig Dispense Refill   aspirin EC 81 MG tablet Take 81 mg by mouth daily. Swallow whole.     atorvastatin (LIPITOR) 40 MG tablet Take 40 mg by mouth daily.     Enclomiphene Citrate POWD 12.5 mg by Does not apply route daily. (Patient taking differently: Take 12.5 mg by mouth daily. Compounded  medication)     ezetimibe (ZETIA) 10 MG tablet Take 10 mg by mouth daily.     famotidine (PEPCID) 20 MG tablet Take 20 mg by mouth at bedtime.     hydrochlorothiazide (MICROZIDE) 12.5 MG capsule Take 12.5 mg by mouth daily.     losartan (COZAAR) 25 MG tablet Take 75 mg by mouth 2 (two) times daily.     Na Sulfate-K Sulfate-Mg Sulfate concentrate (SUPREP) 17.5-3.13-1.6 GM/177ML SOLN Take 1 kit (354 mLs total) by mouth once for 1 dose. 354 mL 0   Omega-3-acid Ethyl Esters (LOVAZA PO) Take 2 capsules by mouth 2 (two) times daily.     omeprazole (PRILOSEC) 20 MG capsule Take 20 mg by mouth daily.     acetaminophen  (TYLENOL ) 500 MG tablet Take 500 mg by mouth every 6 (six) hours as needed for pain.     LORazepam (ATIVAN) 1 MG tablet Take 1 mg by mouth as needed for anxiety  (Uses when flying).      No current facility-administered medications for this visit.   Allergies[2]   Review of Systems: All systems reviewed and negative except where noted in HPI.   No recent labs in Epic shown.   Physical Exam: BP 110/64   Pulse 81   Ht 5' 11 (1.803 m)   Wt 191 lb 6 oz (86.8 kg)   BMI 26.69 kg/m  Constitutional: Pleasant,well-developed, male in no acute distress. HEENT: Normocephalic and atraumatic. Conjunctivae are normal. No scleral icterus. Neck supple.  Cardiovascular: Normal rate, regular rhythm.  Pulmonary/chest: Effort normal and breath sounds normal. No wheezing, rales or rhonchi. Abdominal: Soft, nondistended, nontender.  There are no masses palpable. No hepatomegaly. Extremities: no edema Lymphadenopathy: No cervical adenopathy noted. Neurological: Alert and oriented to person place and time. Skin: Skin is warm and dry. No rashes noted. Psychiatric: Normal mood and affect. Behavior is normal.   ASSESSMENT: 48 y.o. male here for assessment of the following  1. Gastroesophageal reflux disease, unspecified whether esophagitis present   2. Dyspepsia   3. Chronic kidney disease, unspecified CKD stage   4. Colon cancer screening    Longstanding GERD and dyspepsia, he has tried to avoid use of PPIs given his IgA nephropathy/CKD due to concern for worsening renal function.  Symptoms have been ongoing for many years, has been somewhat responsive to Pepcid and PPI but has not resolved and continues to have some breakthrough.  More recently has tried low-dose omeprazole which has helped.  We discussed GERD in general, risks for Barrett's esophagus.  We discussed use of PPI in general, long-term risks which could include worsening of CKD.  I agree with his history of kidney disease would ideally like to avoid PPI use: however, the overall risk for kidney disease is increased with PPIs but does remain low.  He does take low-dose famotidine  over-the-counter.  I think he could escalate this dose to famotidine 20 mg once to twice daily, which he has not tried yet.  Ultimately I offered him an EGD given chronicity of his symptoms, rule out Barrett's esophagus.  If he has Barrett's then we must weigh risks of chronic PPI (worsening CKD) use to treat that versus the benefits of it (which would lower his risk for esophagus cancer in the setting of Barrett's).  We discussed risk benefits of EGD and anesthesia, he understands and wants to proceed.  In the interim I think he can escalate his famotidine to 20 mg nightly or twice daily and see if  that helps.  He can take Tums or Maalox as needed for breakthrough in the interim.  We discussed that based on EGD findings, we will assess his candidacy for TIF which would be an alternative way to treat reflux without use of medication.  TIF may be a consideration over time for him based on how much the symptoms bother him despite H2 blockers, and risks of chronic PPI.  He understands this and wants to proceed with EGD.  We otherwise discussed his colon cancer screening options.  Negative Cologuard 3 years ago.  I do think if he is able to do an optical colonoscopy, that is certainly more proactive and preventative than stool-based testing.  We discussed risks of colonoscopy, he is agreeable to do this at the same time as his upper endoscopy.  Further recommendations pending the results.   PLAN: - counseled on omeprazole - can continue if he would like depending on symptoms, her understands risks - increase pepcid to 20mg  at bedtime, and can increase to BID if needed - offered EGD to screen for BE, assess for hiatal hernia, assess candidacy for TIF,  help guide long term management plan - colonoscopy for screening - discussed options, he wants to do at same time as EGD  Further recommendations pending the results.   Marcey Naval, MD [facility]  CC: Shayne Anes, MD     [1]   Social History Tobacco Use   Smoking status: Never   Smokeless tobacco: Never  Vaping Use   Vaping status: Never Used  Substance Use Topics   Alcohol use: Yes    Comment: 2-3 cocktails weekly   Drug use: No  [2] No Known Allergies  "

## 2024-09-11 NOTE — Patient Instructions (Addendum)
 You have been scheduled for an endoscopy and colonoscopy on 10-18-24 with Dr. Leigh. Please follow the written instructions given to you at your visit today.  If you use inhalers (even only as needed), please bring them with you on the day of your procedure.  DO NOT TAKE 7 DAYS PRIOR TO TEST- Trulicity (dulaglutide) Ozempic, Wegovy (semaglutide) Mounjaro, Zepbound (tirzepatide) Bydureon Bcise (exanatide extended release)  DO NOT TAKE 1 DAY PRIOR TO YOUR TEST Rybelsus (semaglutide) Adlyxin (lixisenatide) Victoza (liraglutide) Byetta (exanatide) ___________________________________________________________________________   Increase Pepcid to 20 mg daily at bedtime  Thank you for entrusting me with your care and for choosing [facility], Dr. Elspeth Leigh  _______________________________________________________  If your blood pressure at your visit was 140/90 or greater, please contact your primary care physician to follow up on this.  _______________________________________________________  If you are age 87 or older, your body mass index should be between 23-30. Your Body mass index is 26.69 kg/m. If this is out of the aforementioned range listed, please consider follow up with your Primary Care Provider.  If you are age 57 or younger, your body mass index should be between 19-25. Your Body mass index is 26.69 kg/m. If this is out of the aformentioned range listed, please consider follow up with your Primary Care Provider.   ________________________________________________________  The [facility] providers would like to encourage you to use MYCHART to communicate with providers for non-urgent requests or questions.  Due to long hold times on the telephone, sending your provider a message by [facility] may be a faster and more efficient way to get a response.  Please allow 48 business hours for a response.  Please remember that this is for non-urgent requests.   _______________________________________________________  [facility] is using a team-based approach to care.  Your team is made up of your doctor and two to three APPS. Our APPS (Nurse Practitioners and Physician Assistants) work with your physician to ensure care continuity for you. They are fully qualified to address your health concerns and develop a treatment plan. They communicate directly with your gastroenterologist to care for you. Seeing the Advanced Practice Practitioners on your physician's team can help you by facilitating care more promptly, often allowing for earlier appointments, access to diagnostic testing, procedures, and other specialty referrals.

## 2024-10-18 ENCOUNTER — Ambulatory Visit: Admitting: Gastroenterology

## 2024-10-18 ENCOUNTER — Encounter: Payer: Self-pay | Admitting: Gastroenterology

## 2024-10-18 VITALS — BP 99/69 | HR 72 | Temp 99.5°F | Resp 14 | Ht 71.0 in | Wt 191.0 lb

## 2024-10-18 DIAGNOSIS — K219 Gastro-esophageal reflux disease without esophagitis: Secondary | ICD-10-CM | POA: Diagnosis not present

## 2024-10-18 DIAGNOSIS — K317 Polyp of stomach and duodenum: Secondary | ICD-10-CM

## 2024-10-18 DIAGNOSIS — K648 Other hemorrhoids: Secondary | ICD-10-CM | POA: Diagnosis not present

## 2024-10-18 DIAGNOSIS — Z1211 Encounter for screening for malignant neoplasm of colon: Secondary | ICD-10-CM | POA: Diagnosis present

## 2024-10-18 MED ORDER — SODIUM CHLORIDE 0.9 % IV SOLN: 500.0000 mL | INTRAVENOUS | Status: DC

## 2024-10-18 NOTE — Progress Notes (Signed)
8350 Ephedrine 10 mg given IV due to low BP, MD updated.

## 2024-10-18 NOTE — Progress Notes (Signed)
 Called to room to assist during endoscopic procedure.  Patient ID and intended procedure confirmed with present staff. Received instructions for my participation in the procedure from the performing physician.

## 2024-10-18 NOTE — Progress Notes (Signed)
 Report given to PACU, vss

## 2024-10-18 NOTE — Op Note (Signed)
 [facility] Patient Name: Collin Wright Procedure Date: 10/18/2024 9:14 AM MRN: [id number] Endoscopist: Elspeth P. Leigh , MD, [id number] Age: 49 Referring MD:  Date of Birth: Oct 22, 1975 Gender: Male Account #: [id number] Procedure:                Colonoscopy Indications:              Screening for colorectal malignant neoplasm, This                            is the patient's first colonoscopy Medicines:                Monitored Anesthesia Care Procedure:                Pre-Anesthesia Assessment:                           - Prior to the procedure, a History and Physical                            was performed, and patient medications and                            allergies were reviewed. The patient's tolerance of                            previous anesthesia was also reviewed. The risks                            and benefits of the procedure and the sedation                            options and risks were discussed with the patient.                            All questions were answered, and informed consent                            was obtained. Prior Anticoagulants: The patient has                            taken no anticoagulant or antiplatelet agents. ASA                            Grade Assessment: II - A patient with mild systemic                            disease. After reviewing the risks and benefits,                            the patient was deemed in satisfactory condition to                            undergo the procedure.  After obtaining informed consent, the colonoscope                            was passed under direct vision. Throughout the                            procedure, the patient's blood pressure, pulse, and                            oxygen saturations were monitored continuously. The                            Olympus Scope [id number] was introduced through the                            anus and  advanced to the the cecum, identified by                            appendiceal orifice and ileocecal valve. The                            colonoscopy was performed without difficulty. The                            patient tolerated the procedure well. The quality                            of the bowel preparation was adequate. The                            ileocecal valve, appendiceal orifice, and rectum                            were photographed. Scope In: 9:46:36 AM Scope Out: 10:08:24 AM Scope Withdrawal Time: 0 hours 17 minutes 24 seconds  Total Procedure Duration: 0 hours 21 minutes 48 seconds  Findings:                 The perianal and digital rectal examinations were                            normal.                           A large amount of liquid stool was found in the                            entire colon, making visualization difficult.                            Lavage of the colon was performed using copious                            amounts of fluid, resulting in eventual clearance  with adequate visualization. This process prolonged                            the exam.                           Internal hemorrhoids were found during                            retroflexion. The hemorrhoids were small.                           The exam was otherwise without abnormality. Complications:            No immediate complications. Estimated blood loss:                            None. Estimated Blood Loss:     Estimated blood loss: none. Impression:               - Stool in the entire examined colon leading to                            extensive lavage but adequate views eventually                            obtained.                           - Internal hemorrhoids.                           - The examination was otherwise normal.                           - No polyps. Recommendation:           - Patient has a contact number available for                             emergencies. The signs and symptoms of potential                            delayed complications were discussed with the                            patient. Return to normal activities tomorrow.                            Written discharge instructions were provided to the                            patient.                           - Resume previous diet.                           -  Continue present medications.                           - Repeat colonoscopy in 10 years for screening                            purposes. Elspeth P. Quida Glasser, MD 10/18/2024 10:15:33 AM This report has been signed electronically.

## 2024-10-18 NOTE — Patient Instructions (Addendum)
 YOU HAD AN ENDOSCOPIC PROCEDURE TODAY AT THE [facility]:   Refer to the procedure report that was given to you for any specific questions about what was found during the examination.  If the procedure report does not answer your questions, please call your gastroenterologist to clarify.  If you requested that your care partner not be given the details of your procedure findings, then the procedure report has been included in a sealed envelope for you to review at your convenience later.  YOU SHOULD EXPECT: Some feelings of bloating in the abdomen. Passage of more gas than usual.  Walking can help get rid of the air that was put into your GI tract during the procedure and reduce the bloating. If you had a lower endoscopy (such as a colonoscopy or flexible sigmoidoscopy) you may notice spotting of blood in your stool or on the toilet paper. If you underwent a bowel prep for your procedure, you may not have a normal bowel movement for a few days.  Please Note:  You might notice some irritation and congestion in your nose or some drainage.  This is from the oxygen used during your procedure.  There is no need for concern and it should clear up in a day or so.  SYMPTOMS TO REPORT IMMEDIATELY:  Following lower endoscopy (colonoscopy or flexible sigmoidoscopy):  Excessive amounts of blood in the stool  Significant tenderness or worsening of abdominal pains  Swelling of the abdomen that is new, acute  Fever of 100F or higher  Following upper endoscopy (EGD)  Vomiting of blood or coffee ground material  New chest pain or pain under the shoulder blades  Painful or persistently difficult swallowing  New shortness of breath  Fever of 100F or higher  Black, tarry-looking stools  Resume previous diet Continue present medications Repeat colonoscopy in 10 years for screening purposes Await pathology results Return to normal activities tomorrow    For urgent or emergent issues, a  gastroenterologist can be reached at any hour by calling [phone]. Do not use MyChart messaging for urgent concerns.    DIET:  We do recommend a small meal at first, but then you may proceed to your regular diet.  Drink plenty of fluids but you should avoid alcoholic beverages for 24 hours.  ACTIVITY:  You should plan to take it easy for the rest of today and you should NOT DRIVE or use heavy machinery until tomorrow (because of the sedation medicines used during the test).    FOLLOW UP: Our staff will call the number listed on your records the next business day following your procedure.  We will call around 7:15- 8:00 am to check on you and address any questions or concerns that you may have regarding the information given to you following your procedure. If we do not reach you, we will leave a message.     If any biopsies were taken you will be contacted by phone or by letter within the next 1-3 weeks.  Please call us  at [phone] if you have not heard about the biopsies in 3 weeks.    SIGNATURES/CONFIDENTIALITY: You and/or your care partner have signed paperwork which will be entered into your electronic medical record.  These signatures attest to the fact that that the information above on your After Visit Summary has been reviewed and is understood.  Full responsibility of the confidentiality of this discharge information lies with you and/or your care-partner.

## 2024-10-18 NOTE — Progress Notes (Signed)
 [facility] History and Physical   Primary Care Physician:  Shayne Anes, MD   Reason for Procedure:   GERD, dyspepsia, CRC screening  Plan:    EGD and colonoscopy     HPI: Collin Wright is a 49 y.o. male  here for EGD and colonoscopy - longstanding GERD / dyspepsia, trying to avoid PPI due to CKD. Recently increased pepcid to 20mg  q HS on top of omeprazole 20mg  q AM. Interested in possible TIF to treat off PPI. EGD to clarify if he is a candidate for TIF, rule out BE, rule out H pylori given history of dyspepsia. First colonoscopy for screening purposes.    Patient denies any bowel symptoms at this time. No family history of colon cancer known. Otherwise feels well without any cardiopulmonary symptoms.   I have discussed risks / benefits of anesthesia and endoscopic procedure with Curtistine Piety and they wish to proceed with the exams as outlined today.   The patient was provided an opportunity to ask questions and all were answered. The patient agreed with the plan.    Past Medical History:  Diagnosis Date   Accelerated hypertension    Acute kidney injury    Chronic kidney disease    GERD (gastroesophageal reflux disease)    Glomerulonephritis    Hyperlipidemia    Hypertension    New diagnosis, 6 weeks ago    Low testosterone    Nephritis    Non-nephrotic range proteinuria    Paresthesias 05/09/2013   Sleep apnea     Past Surgical History:  Procedure Laterality Date   LIPOSUCTION  2020   on Chest   NONE      Prior to Admission medications  Medication Sig Start Date End Date Taking? Authorizing Provider  aspirin EC 81 MG tablet Take 81 mg by mouth daily. Swallow whole.   Yes [provider]  atorvastatin (LIPITOR) 40 MG tablet Take 40 mg by mouth daily.   Yes [provider]  Enclomiphene Citrate POWD 12.5 mg by Does not apply route daily. Patient taking differently: Take 12.5 mg by mouth daily. Compounded medication   Yes  [provider]  ezetimibe (ZETIA) 10 MG tablet Take 10 mg by mouth daily.   Yes [provider]  famotidine (PEPCID) 20 MG tablet Take 20 mg by mouth at bedtime.   Yes [provider]  hydrochlorothiazide (MICROZIDE) 12.5 MG capsule Take 12.5 mg by mouth daily.   Yes [provider]  losartan (COZAAR) 25 MG tablet Take 75 mg by mouth 2 (two) times daily.   Yes [provider]  Omega-3-acid Ethyl Esters (LOVAZA PO) Take 2 capsules by mouth 2 (two) times daily.   Yes [provider]  omeprazole (PRILOSEC) 20 MG capsule Take 20 mg by mouth daily.   Yes [provider]  acetaminophen  (TYLENOL ) 500 MG tablet Take 500 mg by mouth every 6 (six) hours as needed for pain.    [provider]  LORazepam (ATIVAN) 1 MG tablet Take 1 mg by mouth as needed for anxiety (Uses when flying).     [provider]    Current Outpatient Medications  Medication Sig Dispense Refill   aspirin EC 81 MG tablet Take 81 mg by mouth daily. Swallow whole.     atorvastatin (LIPITOR) 40 MG tablet Take 40 mg by mouth daily.     Enclomiphene Citrate POWD 12.5 mg by Does not apply route daily. (Patient taking differently: Take 12.5 mg by mouth daily.  Compounded medication)     ezetimibe (ZETIA) 10 MG tablet Take 10 mg by mouth daily.     famotidine (PEPCID) 20 MG tablet Take 20 mg by mouth at bedtime.     hydrochlorothiazide (MICROZIDE) 12.5 MG capsule Take 12.5 mg by mouth daily.     losartan (COZAAR) 25 MG tablet Take 75 mg by mouth 2 (two) times daily.     Omega-3-acid Ethyl Esters (LOVAZA PO) Take 2 capsules by mouth 2 (two) times daily.     omeprazole (PRILOSEC) 20 MG capsule Take 20 mg by mouth daily.     acetaminophen  (TYLENOL ) 500 MG tablet Take 500 mg by mouth every 6 (six) hours as needed for pain.     LORazepam (ATIVAN) 1 MG tablet Take 1 mg by mouth as needed for anxiety (Uses when flying).      Current Facility-Administered  Medications  Medication Dose Route Frequency Provider Last Rate Last Admin   0.9 %  sodium chloride  infusion  500 mL Intravenous Continuous Avaleigh Decuir, Elspeth SQUIBB, MD        Allergies as of 10/18/2024   (No Known Allergies)    Family History  Problem Relation Age of Onset   Fibromyalgia Mother    Diabetes Mother    Celiac disease Mother    Hypertension Mother    Cancer - Lung Mother    Hypertension Father    CAD Father        DECEASED   Anxiety disorder Sister    Kidney disease Sister    Depression Other    Colon cancer Neg Hx    Esophageal cancer Neg Hx    Rectal cancer Neg Hx    Stomach cancer Neg Hx     Social History   Socioeconomic History   Marital status: Married    Spouse name: Corean   Number of children: 2   Years of education: College   Highest education level: Not on file  Occupational History   Occupation: Primary School Teacher  Tobacco Use   Smoking status: Never   Smokeless tobacco: Never  Vaping Use   Vaping status: Never Used  Substance and Sexual Activity   Alcohol use: Yes    Comment: 2-3 cocktails weekly   Drug use: No   Sexual activity: Yes    Birth control/protection: None  Other Topics Concern   Not on file  Social History Narrative   Patient lives at home with his spouse and children.   Caffeine Use: 3-4 cups of caffeine daily   Social Drivers of Health   Tobacco Use: Low Risk (10/18/2024)   Patient History    Smoking Tobacco Use: Never    Smokeless Tobacco Use: Never    Passive Exposure: Not on file  Financial Resource Strain: Not on file  Food Insecurity: Not on file  Transportation Needs: Not on file  Physical Activity: Not on file  Stress: Not on file  Social Connections: Not on file  Intimate Partner Violence: Not on file  Depression (EYV7-0): Not on file  Alcohol Screen: Not on file  Housing: Not on file  Utilities: Not on file  Health Literacy: Not on file    Review of Systems: All other review of systems  negative except as mentioned in the HPI.  Physical Exam: Vital signs BP 123/70   Pulse 76   Temp 99.5 F (37.5 C) (Temporal)   Ht 5' 11 (1.803 m)   Wt 191 lb (86.6 kg)   SpO2 97%   BMI  26.64 kg/m   General:   Alert,  Well-developed, pleasant and cooperative in NAD Lungs:  Clear throughout to auscultation.   Heart:  Regular rate and rhythm Abdomen:  Soft, nontender and nondistended.   Neuro/Psych:  Alert and cooperative. Normal mood and affect. A and O x 3  Marcey Naval, MD [facility]

## 2024-10-18 NOTE — Progress Notes (Signed)
 9051 Ephedrine 10 mg given IV due to low BP, MD updated.

## 2024-10-18 NOTE — Op Note (Signed)
 [facility] Patient Name: Collin Wright Procedure Date: 10/18/2024 9:23 AM MRN: [id number] Endoscopist: Elspeth P. Leigh , MD, [id number] Age: 49 Referring MD:  Date of Birth: 03-19-1976 Gender: Male Account #: [id number] Procedure:                Upper GI endoscopy Indications:              h/o dyspepsia and GERD - hoping to avoid long term                            use of chronic PPI due to CKD. On omeprazole 20mg  q                            AM and pepcid 20mg  q HS with good control of                            symptoms. Rule out BE, assess candidacy for TIF if                            considered in the future. Medicines:                Monitored Anesthesia Care Procedure:                Pre-Anesthesia Assessment:                           - Prior to the procedure, a History and Physical                            was performed, and patient medications and                            allergies were reviewed. The patient's tolerance of                            previous anesthesia was also reviewed. The risks                            and benefits of the procedure and the sedation                            options and risks were discussed with the patient.                            All questions were answered, and informed consent                            was obtained. Prior Anticoagulants: The patient has                            taken no anticoagulant or antiplatelet agents. ASA                            Grade Assessment: II - A patient with mild  systemic                            disease. After reviewing the risks and benefits,                            the patient was deemed in satisfactory condition to                            undergo the procedure.                           After obtaining informed consent, the endoscope was                            passed under direct vision. Throughout the                            procedure, the patient's  blood pressure, pulse, and                            oxygen saturations were monitored continuously. The                            GIF [id number] was introduced through the                            mouth, and advanced to the second part of duodenum.                            The upper GI endoscopy was accomplished without                            difficulty. The patient tolerated the procedure                            well. Scope In: Scope Out: Findings:                 Esophagogastric landmarks were identified: the                            Z-line was found at 44 cm, the gastroesophageal                            junction was found at 44 cm and the upper extent of                            the gastric folds was found at 44 cm from the                            incisors.                           The gastroesophageal flap valve was visualized  endoscopically and classified as Hill Grade I                            (prominent fold, tight to endoscope).                           The exam of the esophagus was otherwise normal. No                            Barrett's esophagus.                           Multiple small sessile polyps were found in the                            gastric fundus and in the gastric body. These are                            very likely benign fundic gland polyps. Several                            lesions biopsied as a representative sample with a                            cold forceps for histology.                           The exam of the stomach was otherwise normal.                           Biopsies were taken with a cold forceps for                            Helicobacter pylori testing.                           The examined duodenum was normal. Complications:            No immediate complications. Estimated blood loss:                            Minimal. Estimated Blood Loss:     Estimated blood loss was  minimal. Impression:               - Esophagogastric landmarks identified.                           - Gastroesophageal flap valve classified as Hill                            Grade I (prominent fold, tight to endoscope).                           - Normal esophagus otherwise.                           -  Multiple gastric polyps. Likely benign fundic                            gland polyps. Biopsied.                           - Normal stomach otherwise - biopsies taken to rule                            out H pylori.                           - Normal examined duodenum. Recommendation:           - Patient has a contact number available for                            emergencies. The signs and symptoms of potential                            delayed complications were discussed with the                            patient. Return to normal activities tomorrow.                            Written discharge instructions were provided to the                            patient.                           - Resume previous diet.                           - Continue present medications.                           - Consideration for changing from omeprazole q AM                            to pepcid 20mg  BID. Consideration for TIF for long                            term management if pepcid does not provide adequate                            control and [name].                           - Await pathology results. Elspeth P. Delsa Walder, MD 10/18/2024 10:20:50 AM This report has been signed electronically.

## 2024-10-18 NOTE — Progress Notes (Signed)
0932 Robinul 0.1 mg IV given due large amount of secretions upon assessment.  MD made aware, vss 

## 2024-10-19 ENCOUNTER — Telehealth: Payer: Self-pay

## 2024-10-19 NOTE — Telephone Encounter (Signed)
" °  Follow up Call-     10/18/2024    9:10 AM  Call back number  Post procedure Call Back phone  # [phone]  Permission to leave phone message Yes     Left message "

## 2024-10-20 LAB — SURGICAL PATHOLOGY

## 2024-10-23 ENCOUNTER — Ambulatory Visit: Payer: Self-pay | Admitting: Gastroenterology
